# Patient Record
Sex: Female | Born: 1995 | Race: Black or African American | Hispanic: No | Marital: Single | Smoking: Never smoker
Health system: Southern US, Community
[De-identification: ages and names within clinical notes are randomized; demographics above are authoritative.]

## PROBLEM LIST (undated history)

## (undated) DIAGNOSIS — J45909 Unspecified asthma, uncomplicated: Secondary | ICD-10-CM

## (undated) DIAGNOSIS — F419 Anxiety disorder, unspecified: Secondary | ICD-10-CM

## (undated) HISTORY — PX: NO PAST SURGERIES: SHX2092

---
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
## 2014-08-24 ENCOUNTER — Emergency Department: Payer: Self-pay | Admitting: Emergency Medicine

## 2015-05-28 ENCOUNTER — Emergency Department
Admission: EM | Admit: 2015-05-28 | Discharge: 2015-05-28 | Disposition: A | Discharge status: Home / Self Care | Payer: BC Managed Care – PPO | Attending: Emergency Medicine | Admitting: Emergency Medicine

## 2015-05-28 ENCOUNTER — Encounter: Payer: Self-pay | Admitting: *Deleted

## 2015-05-28 DIAGNOSIS — J9801 Acute bronchospasm: Secondary | ICD-10-CM | POA: Diagnosis not present

## 2015-05-28 DIAGNOSIS — R062 Wheezing: Secondary | ICD-10-CM

## 2015-05-28 MED ORDER — BENZONATATE 100 MG PO CAPS: 100.0000 mg | ORAL_CAPSULE | Freq: Three times a day (TID) | ORAL | Status: DC | PRN

## 2015-05-28 MED ORDER — ACETAMINOPHEN 500 MG PO TABS
1000.0000 mg | ORAL_TABLET | Freq: Once | ORAL | Status: AC
  Administered 2015-05-28: 500 mg via ORAL
  Filled 2015-05-28: qty 2

## 2015-05-28 MED ORDER — IPRATROPIUM-ALBUTEROL 0.5-2.5 (3) MG/3ML IN SOLN
3.0000 mL | Freq: Once | RESPIRATORY_TRACT | Status: AC
  Administered 2015-05-28: 3 mL via RESPIRATORY_TRACT
  Filled 2015-05-28: qty 3

## 2015-05-28 NOTE — ED Notes (Signed)
Pt seen by ems and given a breathing treatment this morning.  Pt went to her doctor and was started on prednisone.  Tonight pt awakened upset and started wheezing again.  Pt brouight to er by mother for an eval

## 2015-05-28 NOTE — ED Notes (Signed)
Pt brought in for wheezing today.  Pt seen by ems this morning and again had breathing diff this evening so mother brought pt in for re eval.

## 2015-05-28 NOTE — Discharge Instructions (Signed)
Asthma, Acute Bronchospasm °Acute bronchospasm caused by asthma is also referred to as an asthma attack. Bronchospasm means your air passages become narrowed. The narrowing is caused by inflammation and tightening of the muscles in the air tubes (bronchi) in your lungs. This can make it hard to breathe or cause you to wheeze and cough. °CAUSES °Possible triggers are: °· Animal dander from the skin, hair, or feathers of animals. °· Dust mites contained in house dust. °· Cockroaches. °· Pollen from trees or grass. °· Mold. °· Cigarette or tobacco smoke. °· Air pollutants such as dust, household cleaners, hair sprays, aerosol sprays, paint fumes, strong chemicals, or strong odors. °· Cold air or weather changes. Cold air may trigger inflammation. Winds increase molds and pollens in the air. °· Strong emotions such as crying or laughing hard. °· Stress. °· Certain medicines such as aspirin or beta-blockers. °· Sulfites in foods and drinks, such as dried fruits and wine. °· Infections or inflammatory conditions, such as a flu, cold, or inflammation of the nasal membranes (rhinitis). °· Gastroesophageal reflux disease (GERD). GERD is a condition where stomach acid backs up into your esophagus. °· Exercise or strenuous activity. °SIGNS AND SYMPTOMS  °· Wheezing. °· Excessive coughing, particularly at night. °· Chest tightness. °· Shortness of breath. °DIAGNOSIS  °Your health care provider will ask you about your medical history and perform a physical exam. A chest X-ray or blood testing may be performed to look for other causes of your symptoms or other conditions that may have triggered your asthma attack.  °TREATMENT  °Treatment is aimed at reducing inflammation and opening up the airways in your lungs.  Most asthma attacks are treated with inhaled medicines. These include quick relief or rescue medicines (such as bronchodilators) and controller medicines (such as inhaled corticosteroids). These medicines are sometimes  given through an inhaler or a nebulizer. Systemic steroid medicine taken by mouth or given through an IV tube also can be used to reduce the inflammation when an attack is moderate or severe. Antibiotic medicines are only used if a bacterial infection is present.  °HOME CARE INSTRUCTIONS  °· Rest. °· Drink plenty of liquids. This helps the mucus to remain thin and be easily coughed up. Only use caffeine in moderation and do not use alcohol until you have recovered from your illness. °· Do not smoke. Avoid being exposed to secondhand smoke. °· You play a critical role in keeping yourself in good health. Avoid exposure to things that cause you to wheeze or to have breathing problems. °· Keep your medicines up-to-date and available. Carefully follow your health care provider's treatment plan. °· Take your medicine exactly as prescribed. °· When pollen or pollution is bad, keep windows closed and use an air conditioner or go to places with air conditioning. °· Asthma requires careful medical care. See your health care provider for a follow-up as advised. If you are more than [redacted] weeks pregnant and you were prescribed any new medicines, let your obstetrician know about the visit and how you are doing. Follow up with your health care provider as directed. °· After you have recovered from your asthma attack, make an appointment with your outpatient doctor to talk about ways to reduce the likelihood of future attacks. If you do not have a doctor who manages your asthma, make an appointment with a primary care doctor to discuss your asthma. °SEEK IMMEDIATE MEDICAL CARE IF:  °· You are getting worse. °· You have trouble breathing. If severe, call your local   emergency services (911 in the [place]  You develop chest pain or discomfort.  You are vomiting.  You are not able to keep fluids down.  You are coughing up yellow, green, brown, or bloody sputum.  You have a fever and your symptoms suddenly get worse.  You have  trouble swallowing. MAKE SURE YOU:   Understand these instructions.  Will watch your condition.  Will get help right away if you are not doing well or get worse. Document Released: 12/03/2006 Document Revised: 08/23/2013 Document Reviewed: 02/23/2013 [facility] Patient Information 2015 [place], [place]. This information is not intended to replace advice given to you by your health care provider. Make sure you discuss any questions you have with your health care provider.  Continue the previously prescribed steroids and albuterol inhaler. Consider starting an OTC allergy medicine (Allegra, Claritin, Zyrtec).  Follow-up with your primary provider as scheduled. Return to the ED as needed.
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
## 2015-07-28 ENCOUNTER — Emergency Department: Payer: BC Managed Care – PPO

## 2015-07-28 ENCOUNTER — Emergency Department
Admission: EM | Admit: 2015-07-28 | Discharge: 2015-07-28 | Disposition: A | Discharge status: Home / Self Care | Payer: BC Managed Care – PPO | Attending: Student | Admitting: Student

## 2015-07-28 DIAGNOSIS — Y9389 Activity, other specified: Secondary | ICD-10-CM | POA: Diagnosis not present

## 2015-07-28 DIAGNOSIS — R42 Dizziness and giddiness: Secondary | ICD-10-CM | POA: Insufficient documentation

## 2015-07-28 DIAGNOSIS — Y92410 Unspecified street and highway as the place of occurrence of the external cause: Secondary | ICD-10-CM | POA: Diagnosis not present

## 2015-07-28 DIAGNOSIS — Z3202 Encounter for pregnancy test, result negative: Secondary | ICD-10-CM | POA: Diagnosis not present

## 2015-07-28 DIAGNOSIS — S0990XA Unspecified injury of head, initial encounter: Secondary | ICD-10-CM | POA: Diagnosis not present

## 2015-07-28 DIAGNOSIS — Y998 Other external cause status: Secondary | ICD-10-CM | POA: Insufficient documentation

## 2015-07-28 DIAGNOSIS — R55 Syncope and collapse: Secondary | ICD-10-CM | POA: Diagnosis not present

## 2015-07-28 HISTORY — DX: Anxiety disorder, unspecified: F41.9

## 2015-07-28 HISTORY — DX: Unspecified asthma, uncomplicated: J45.909

## 2015-07-28 LAB — POCT PREGNANCY, URINE: PREG TEST UR: NEGATIVE

## 2015-07-28 LAB — BASIC METABOLIC PANEL
Anion gap: 7 (ref 5–15)
BUN: 7 mg/dL (ref 6–20)
CHLORIDE: 107 mmol/L (ref 101–111)
CO2: 24 mmol/L (ref 22–32)
CREATININE: 0.74 mg/dL (ref 0.44–1.00)
Calcium: 8.8 mg/dL — ABNORMAL LOW (ref 8.9–10.3)
GFR calc Af Amer: >60 mL/min (ref >60)
GFR calc non Af Amer: >60 mL/min (ref >60)
GLUCOSE: 107 mg/dL — AB (ref 65–99)
POTASSIUM: 3.8 mmol/L (ref 3.5–5.1)
Sodium: 138 mmol/L (ref 135–145)

## 2015-07-28 LAB — CBC
HEMATOCRIT: 36.5 % (ref 35.0–47.0)
Hemoglobin: 12.1 g/dL (ref 12.0–16.0)
MCH: 26.7 pg (ref 26.0–34.0)
MCHC: 33.1 g/dL (ref 32.0–36.0)
MCV: 80.7 fL (ref 80.0–100.0)
PLATELETS: 243 10*3/uL (ref 150–440)
RBC: 4.52 MIL/uL (ref 3.80–5.20)
RDW: 15.7 % — AB (ref 11.5–14.5)
WBC: 8 10*3/uL (ref 3.6–11.0)

## 2015-07-28 LAB — URINALYSIS COMPLETE WITH MICROSCOPIC (ARMC ONLY)
Bilirubin Urine: NEGATIVE
Glucose, UA: NEGATIVE mg/dL
Ketones, ur: NEGATIVE mg/dL
Nitrite: NEGATIVE
Protein, ur: 30 mg/dL — AB
Specific Gravity, Urine: 1.02 (ref 1.005–1.030)
pH: 5 (ref 5.0–8.0)

## 2015-07-28 NOTE — ED Notes (Signed)
Patient returned from radiology
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
## 2015-07-28 NOTE — ED Notes (Signed)
Patient transported to X-ray 
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
## 2016-04-14 ENCOUNTER — Emergency Department
Admission: EM | Admit: 2016-04-14 | Discharge: 2016-04-14 | Disposition: A | Discharge status: Home / Self Care | Payer: BC Managed Care – PPO | Attending: Student in an Organized Health Care Education/Training Program | Admitting: Student in an Organized Health Care Education/Training Program

## 2016-04-14 ENCOUNTER — Encounter: Payer: Self-pay | Admitting: *Deleted

## 2016-04-14 DIAGNOSIS — Z79899 Other long term (current) drug therapy: Secondary | ICD-10-CM | POA: Diagnosis not present

## 2016-04-14 DIAGNOSIS — R454 Irritability and anger: Secondary | ICD-10-CM | POA: Diagnosis not present

## 2016-04-14 DIAGNOSIS — R45851 Suicidal ideations: Secondary | ICD-10-CM | POA: Diagnosis present

## 2016-04-14 DIAGNOSIS — J45909 Unspecified asthma, uncomplicated: Secondary | ICD-10-CM | POA: Insufficient documentation

## 2016-04-14 LAB — CBC
HEMATOCRIT: 37.5 % (ref 35.0–47.0)
Hemoglobin: 12.4 g/dL (ref 12.0–16.0)
MCH: 26.9 pg (ref 26.0–34.0)
MCHC: 33.1 g/dL (ref 32.0–36.0)
MCV: 81.2 fL (ref 80.0–100.0)
Platelets: 252 10*3/uL (ref 150–440)
RBC: 4.62 MIL/uL (ref 3.80–5.20)
RDW: 15.1 % — AB (ref 11.5–14.5)
WBC: 8.2 10*3/uL (ref 3.6–11.0)

## 2016-04-14 LAB — COMPREHENSIVE METABOLIC PANEL
ALBUMIN: 3.8 g/dL (ref 3.5–5.0)
ALT: 14 U/L (ref 14–54)
ANION GAP: 9 (ref 5–15)
AST: 19 U/L (ref 15–41)
Alkaline Phosphatase: 64 U/L (ref 38–126)
BILIRUBIN TOTAL: 0.3 mg/dL (ref 0.3–1.2)
BUN: 9 mg/dL (ref 6–20)
CHLORIDE: 108 mmol/L (ref 101–111)
CO2: 20 mmol/L — AB (ref 22–32)
Calcium: 8.9 mg/dL (ref 8.9–10.3)
Creatinine, Ser: 0.81 mg/dL (ref 0.44–1.00)
GFR calc Af Amer: >60 mL/min (ref >60)
GFR calc non Af Amer: >60 mL/min (ref >60)
GLUCOSE: 87 mg/dL (ref 65–99)
POTASSIUM: 4 mmol/L (ref 3.5–5.1)
SODIUM: 137 mmol/L (ref 135–145)
TOTAL PROTEIN: 7.4 g/dL (ref 6.5–8.1)

## 2016-04-14 LAB — URINE DRUG SCREEN, QUALITATIVE (ARMC ONLY)
AMPHETAMINES, UR SCREEN: NOT DETECTED
BENZODIAZEPINE, UR SCRN: NOT DETECTED
Barbiturates, Ur Screen: NOT DETECTED
Cannabinoid 50 Ng, Ur ~~LOC~~: NOT DETECTED
Cocaine Metabolite,Ur ~~LOC~~: NOT DETECTED
MDMA (ECSTASY) UR SCREEN: NOT DETECTED
METHADONE SCREEN, URINE: NOT DETECTED
OPIATE, UR SCREEN: NOT DETECTED
Phencyclidine (PCP) Ur S: NOT DETECTED
Tricyclic, Ur Screen: NOT DETECTED

## 2016-04-14 LAB — POCT PREGNANCY, URINE: PREG TEST UR: NEGATIVE

## 2016-04-14 LAB — SALICYLATE LEVEL: Salicylate Lvl: <4 mg/dL (ref 2.8–30.0)

## 2016-04-14 LAB — ACETAMINOPHEN LEVEL

## 2016-04-14 LAB — ETHANOL: Alcohol, Ethyl (B): <5 mg/dL (ref <5)

## 2016-04-14 NOTE — ED Provider Notes (Signed)
[facility] Emergency Department Provider Note    First MD Initiated Contact with Patient 04/14/16 2202     (approximate)  I have reviewed the triage vital signs and the nursing notes.   HISTORY  Chief Complaint Suicidal    HPI Lisa Hogan is a 20 y.o. female who presents for a brief episode of suicidal thoughts while she was in an argument with her boyfriend. Patient states that she has some underlying anxiety but has not sought care for this previously. States that she was tasting with her boyfriend during this argument became very angry and emotional and stated in text to that she wanted to die. States that she had not previously had any thoughts of suicidal ideation. No history of suicide attempts previously. Denies any intent to harm anyone else.   Past Medical History:  Diagnosis Date  . Anxiety   . Asthma     There are no active problems to display for this patient.   History reviewed. No pertinent surgical history.  Prior to Admission medications   Medication Sig Start Date End Date Taking? Authorizing Provider  albuterol (PROVENTIL HFA;VENTOLIN HFA) 108 (90 BASE) MCG/ACT inhaler Inhale 2 puffs into the lungs every 6 (six) hours as needed for wheezing or shortness of breath.   Yes Historical Provider, MD  CRYSELLE-28 0.3-30 MG-MCG tablet Take 1 tablet by mouth daily.   Yes Historical Provider, MD  benzonatate (TESSALON PERLES) 100 MG capsule Take 1 capsule (100 mg total) by mouth 3 (three) times daily as needed for cough (Take 1-2 per dose). Patient not taking: Reported on 04/14/2016 05/28/15   Charlesetta IvoryJenise V Bacon Menshew, PA-C    Allergies Shellfish allergy  History reviewed. No pertinent family history.  Social History Social History  Substance Use Topics  . Smoking status: Never Smoker  . Smokeless tobacco: Not on file  . Alcohol use No    Review of Systems Patient denies headaches, rhinorrhea, blurry vision, numbness, shortness of  breath, chest pain, edema, cough, abdominal pain, nausea, vomiting, diarrhea, dysuria, fevers, rashes or hallucinations unless otherwise stated above in HPI. ____________________________________________   PHYSICAL EXAM:  VITAL SIGNS: Vitals:   04/14/16 2052  BP: 139/87  Pulse: 83  Temp: 98.9 F (37.2 C)    Constitutional: Alert and oriented. Well appearing and in no acute distress. Eyes: Conjunctivae are normal. PERRL. EOMI. Head: Atraumatic. Nose: No congestion/rhinnorhea. Mouth/Throat: Mucous membranes are moist.  Oropharynx non-erythematous. Neck: No stridor. Painless ROM. No cervical spine tenderness to palpation Hematological/Lymphatic/Immunilogical: No cervical lymphadenopathy. Cardiovascular: Normal rate, regular rhythm. Grossly normal heart sounds.  Good peripheral circulation. Respiratory: Normal respiratory effort.  No retractions. Lungs CTAB. Gastrointestinal: Soft and nontender. No distention. No abdominal bruits. No CVA tenderness. Genitourinary:  Musculoskeletal: No lower extremity tenderness nor edema.  No joint effusions. Neurologic:  Normal speech and language. No gross focal neurologic deficits are appreciated. No gait instability. Skin:  Skin is warm, dry and intact. No rash noted. Psychiatric: Mood and affect are normal. Speech and behavior are normal.  ____________________________________________   LABS (all labs ordered are listed, but only abnormal results are displayed)  Results for orders placed or performed during the hospital encounter of 04/14/16 (from the past 24 hour(s))  Comprehensive metabolic panel     Status: Abnormal   Collection Time: 04/14/16  9:05 PM  Result Value Ref Range   Sodium 137 135 - 145 mmol/L   Potassium 4.0 3.5 - 5.1 mmol/L   Chloride 108 101 - 111  mmol/L   CO2 20 (L) 22 - 32 mmol/L   Glucose, Bld 87 65 - 99 mg/dL   BUN 9 6 - 20 mg/dL   Creatinine, Ser 1.610.81 0.44 - 1.00 mg/dL   Calcium 8.9 8.9 - 09.610.3 mg/dL   Total  Protein 7.4 6.5 - 8.1 g/dL   Albumin 3.8 3.5 - 5.0 g/dL   AST 19 15 - 41 U/L   ALT 14 14 - 54 U/L   Alkaline Phosphatase 64 38 - 126 U/L   Total Bilirubin 0.3 0.3 - 1.2 mg/dL   GFR calc non Af Amer >60 >60 mL/min   GFR calc Af Amer >60 >60 mL/min   Anion gap 9 5 - 15  Ethanol     Status: None   Collection Time: 04/14/16  9:05 PM  Result Value Ref Range   Alcohol, Ethyl (B) <5 <5 mg/dL  Salicylate level     Status: None   Collection Time: 04/14/16  9:05 PM  Result Value Ref Range   Salicylate Lvl <4.0 2.8 - 30.0 mg/dL  Acetaminophen level     Status: Abnormal   Collection Time: 04/14/16  9:05 PM  Result Value Ref Range   Acetaminophen (Tylenol), Serum <10 (L) 10 - 30 ug/mL  cbc     Status: Abnormal   Collection Time: 04/14/16  9:05 PM  Result Value Ref Range   WBC 8.2 3.6 - 11.0 K/uL   RBC 4.62 3.80 - 5.20 MIL/uL   Hemoglobin 12.4 12.0 - 16.0 g/dL   HCT 04.537.5 40.935.0 - 81.147.0 %   MCV 81.2 80.0 - 100.0 fL   MCH 26.9 26.0 - 34.0 pg   MCHC 33.1 32.0 - 36.0 g/dL   RDW 91.415.1 (H) 78.211.5 - 95.614.5 %   Platelets 252 150 - 440 K/uL  Urine Drug Screen, Qualitative     Status: None   Collection Time: 04/14/16  9:05 PM  Result Value Ref Range   Tricyclic, Ur Screen NONE DETECTED NONE DETECTED   Amphetamines, Ur Screen NONE DETECTED NONE DETECTED   MDMA (Ecstasy)Ur Screen NONE DETECTED NONE DETECTED   Cocaine Metabolite,Ur [place] NONE DETECTED NONE DETECTED   Opiate, Ur Screen NONE DETECTED NONE DETECTED   Phencyclidine (PCP) Ur S NONE DETECTED NONE DETECTED   Cannabinoid 50 Ng, Ur [place] NONE DETECTED NONE DETECTED   Barbiturates, Ur Screen NONE DETECTED NONE DETECTED   Benzodiazepine, Ur Scrn NONE DETECTED NONE DETECTED   Methadone Scn, Ur NONE DETECTED NONE DETECTED  Pregnancy, urine POC     Status: None   Collection Time: 04/14/16  9:25 PM  Result Value Ref Range   Preg Test, Ur NEGATIVE NEGATIVE    ____________________________________________   ____________________________________________   PROCEDURES  Procedure(s) performed: none    Critical Care performed: no ____________________________________________   INITIAL IMPRESSION / ASSESSMENT AND PLAN / ED COURSE  Pertinent labs & imaging results that were available during my care of the patient were reviewed by me and considered in my medical decision making (see chart for details).  DDX: Anxiety disorder, suicidal ideation, depression, agitation  Lisa Hogan is a 20 y.o. who presents to the ED with brief suicidal thoughts while in an argument with her boyfriend today. Patient denies any suicidal ideation at this time. She does not have a plan. She has no previous history of suicidal attempts. She appears of right mind with clear sensorium and appropriate thought process. She does not demonstrate any instability. Do not feel the patient is currently at  risk for herself. Do feel this is secondary to brief outburst during an argument. I discussed need for follow-up with PCP and provided referral for psychiatry. Discussed signs and symptoms for which she should return emergently to the hospital.  Have discussed with the patient and available family all diagnostics and treatments performed thus far and all questions were answered to the best of my ability. The patient demonstrates understanding and agreement with plan.   Clinical Course     ____________________________________________   FINAL CLINICAL IMPRESSION(S) / ED DIAGNOSES  Final diagnoses:  Anger reaction      NEW MEDICATIONS STARTED DURING THIS VISIT:  New Prescriptions   No medications on file     Note:  This document was prepared using Dragon voice recognition software and may include unintentional dictation errors.    Willy Eddy, MD 04/14/16 [phone]

## 2016-04-14 NOTE — ED Notes (Signed)
Discharge instructions reviewed with patient. Questions fielded by this RN. Patient verbalizes understanding of instructions. Patient discharged home in stable condition per Robinson MD . No acute distress noted at time of discharge.   

## 2016-04-14 NOTE — ED Triage Notes (Signed)
Pt states she felt badly about lying to her boyfriend for a couple of days. Pt states she texted that she wanted to kill herself to her boyfriend. Pt denies plan and states she had thought about it but never acted upon thoughts to harm herself. Pt denies use of ETOH and drugs today.

## 2016-04-14 NOTE — Discharge Instructions (Signed)
Please return immediately to the emergency department should she have any persistent thoughts of harming herself or others. Return to the ER if you have any additional concerns or questions. Please follow-up with her primary care physician for further management of her symptoms.

## 2016-04-14 NOTE — ED Notes (Signed)
Patient refused to speak to MD and this writer while out in the hallway. Room is being cleaned so patient can be interviewed there.

## 2016-07-25 ENCOUNTER — Ambulatory Visit
Admission: EM | Admit: 2016-07-25 | Discharge: 2016-07-25 | Disposition: A | Discharge status: Home / Self Care | Payer: Worker's Compensation | Attending: Family Medicine | Admitting: Family Medicine

## 2016-07-25 ENCOUNTER — Ambulatory Visit (INDEPENDENT_AMBULATORY_CARE_PROVIDER_SITE_OTHER): Payer: Worker's Compensation

## 2016-07-25 DIAGNOSIS — S86911A Strain of unspecified muscle(s) and tendon(s) at lower leg level, right leg, initial encounter: Secondary | ICD-10-CM

## 2016-07-25 DIAGNOSIS — M25561 Pain in right knee: Secondary | ICD-10-CM

## 2016-07-25 MED ORDER — IBUPROFEN 800 MG PO TABS: 800.0000 mg | ORAL_TABLET | Freq: Three times a day (TID) | ORAL | 0 refills | Status: AC

## 2016-07-25 NOTE — Discharge Instructions (Signed)
Take ibuprofen as needed for pain Follow up with occupational health next week for re-evaluation.

## 2016-07-25 NOTE — ED Provider Notes (Signed)
CSN: [id number]     Arrival date & time 07/25/16  1716 History   First MD Initiated Contact with Patient 07/25/16 1907     Chief Complaint  Patient presents with  . Knee Pain   (Consider location/radiation/quality/duration/timing/severity/associated sxs/prior Treatment) Patient is a well-appearing 20 y.o female, presents today for right knee pain. She had a fall last night around 10 pm. Patient states that she was running at work last night due to a fire alarm and fell on right knee stating that it "Slammed down pretty hard". Patient reports that the knee pain started this morning around 11 am. Patient reports that she is able to ambulate but is painful to walk. She reports the pain 3/10 currently. She denies swelling at the area.       Past Medical History:  Diagnosis Date  . Anxiety   . Asthma    Past Surgical History:  Procedure Laterality Date  . NO PAST SURGERIES     History reviewed. No pertinent family history. Social History  Substance Use Topics  . Smoking status: Never Smoker  . Smokeless tobacco: Never Used  . Alcohol use Yes     Comment: occasionally   OB History    No data available     Review of Systems  All other systems reviewed and are negative.   Allergies  Shellfish allergy  Home Medications   Prior to Admission medications   Medication Sig Start Date End Date Taking? Authorizing Provider  albuterol (PROVENTIL HFA;VENTOLIN HFA) 108 (90 BASE) MCG/ACT inhaler Inhale 2 puffs into the lungs every 6 (six) hours as needed for wheezing or shortness of breath.   Yes Historical Provider, MD  CRYSELLE-28 0.3-30 MG-MCG tablet Take 1 tablet by mouth daily.   Yes Historical Provider, MD  ibuprofen (ADVIL,MOTRIN) 800 MG tablet Take 1 tablet (800 mg total) by mouth 3 (three) times daily. 07/25/16 07/30/16  Lucia EstelleFeng Athanasios Heldman, NP   Meds Ordered and Administered this Visit  Medications - No data to display  BP 120/60 (BP Location: Left Arm)   Pulse 82   Temp 98.7 F  (37.1 C) (Tympanic)   Resp 17   Ht 5\' 9"  (1.753 m)   Wt 198 lb (89.8 kg)   LMP 07/11/2016   SpO2 100%   BMI 29.24 kg/m  No data found.   Physical Exam  Constitutional: She appears well-developed and well-nourished.  Cardiovascular: Normal rate, regular rhythm and normal heart sounds.   Pulmonary/Chest: Effort normal and breath sounds normal.  Musculoskeletal:  Right knee has full ROM, has some tenderness over the patella on palpation. She is ambulatory.   Nursing note and vitals reviewed.   Urgent Care Course   Clinical Course     Procedures (including critical care time)  Labs Review Labs Reviewed - No data to display  Imaging Review Dg Knee Complete 4 Views Right  Result Date: 07/25/2016 CLINICAL DATA:  Right knee pain after fall EXAM: RIGHT KNEE - COMPLETE 4+ VIEW COMPARISON:  None. FINDINGS: No evidence of fracture, dislocation, or joint effusion. No evidence of arthropathy or other focal bone abnormality. Soft tissues are unremarkable. IMPRESSION: Negative. Electronically Signed   By: Jasmine PangKim  Fujinaga M.D.   On: 07/25/2016 18:58     Visual Acuity Review  Right Eye Distance:   Left Eye Distance:   Bilateral Distance:    Right Eye Near:   Left Eye Near:    Bilateral Near:         MDM   1.  Acute pain of right knee   2. Strain of right knee, initial encounter    1) Most likely just a soft tissue injury. Continue to monitor the pain at home. Take ibuprofen PRN for pain. If pain does not improve in 1-2 weeks, then please follow up to have it further evaluated.   2) Work note given to return back to work tomorrow on [employer] duty with restrictions for 5 days and f/u with occupational health in 5 days prior to returning back on full duty.     Lucia EstelleFeng Mekiah [place], NP 07/25/16 1942

## 2016-07-25 NOTE — ED Triage Notes (Signed)
Patient complains of falling last night at work around 945pm-10pm. Patient states that she was running at work last night due to a fire alarm. Patient fell on her right knee, states that it "slammed down pretty hard". Patient reports that she didn't mention anything at work because she did not have pain until this morning around 1130am. Patient states that the pain is worse with bending.

## 2017-04-17 ENCOUNTER — Emergency Department
Admission: EM | Admit: 2017-04-17 | Discharge: 2017-04-17 | Disposition: A | Discharge status: Home / Self Care | Payer: BC Managed Care – PPO | Attending: Emergency Medicine | Admitting: Emergency Medicine

## 2017-04-17 ENCOUNTER — Encounter: Payer: Self-pay | Admitting: Emergency Medicine

## 2017-04-17 DIAGNOSIS — J45909 Unspecified asthma, uncomplicated: Secondary | ICD-10-CM | POA: Insufficient documentation

## 2017-04-17 DIAGNOSIS — Z79899 Other long term (current) drug therapy: Secondary | ICD-10-CM | POA: Insufficient documentation

## 2017-04-17 DIAGNOSIS — F419 Anxiety disorder, unspecified: Secondary | ICD-10-CM | POA: Insufficient documentation

## 2017-04-17 DIAGNOSIS — R0602 Shortness of breath: Secondary | ICD-10-CM | POA: Diagnosis present

## 2017-04-17 DIAGNOSIS — R0600 Dyspnea, unspecified: Secondary | ICD-10-CM

## 2017-04-17 NOTE — ED Notes (Signed)
When patient asked if she thought it was anxiety or her asthma she states, "This is my anxiety". Denies HI or SI. Denies taking any medication for anxiety.

## 2017-04-17 NOTE — ED Provider Notes (Signed)
[facility] Emergency Department Provider Note  Time seen: 6:22 PM  I have reviewed the triage vital signs and the nursing notes.   HISTORY  Chief Complaint Panic Attack    HPI Lisa Hogan is a 21 y.o. female with a past medical history of asthma, anxiety, presents the emergency department with shortness of breath. 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 She just finished jogging 1.5 milesand began feeling extremely short of breath. Patient states she cannot catch her breath and was hyperventilating. EMS was on scene and checked her vitals which showed a normal pulse ox per report. Patient denied any wheeze chest pain or chest tightness. States a history of anxiety and states this felt like a panic attack to her. Upon arrival to the emergency department the patient is calm with no complaints. Denies any trouble breathing. Denies any chest pain. Denies any abdominal pain. States while jogging she did have a pain in her right lower abdomen but states this happens every time she runs and denies any current discomfort.  Past Medical History:  Diagnosis Date  . Anxiety   . Asthma     There are no active problems to display for this patient.   Past Surgical History:  Procedure Laterality Date  . NO PAST SURGERIES      Prior to Admission medications   Medication Sig Start Date End Date Taking? Authorizing Provider  albuterol (PROVENTIL HFA;VENTOLIN HFA) 108 (90 BASE) MCG/ACT inhaler Inhale 2 puffs into the lungs every 6 (six) hours as needed for wheezing or shortness of breath.    [provider]  CRYSELLE-28 0.3-30 MG-MCG tablet Take 1 tablet by mouth daily.    [provider]    Allergies  Allergen Reactions  . Shellfish Allergy Anaphylaxis    No family history on file.  Social History Social History  Substance Use Topics  . Smoking status: Never Smoker  . Smokeless tobacco:  Never Used  . Alcohol use Yes     Comment: occasionally    Review of Systems Constitutional: Negative for fever. Cardiovascular: Negative for chest pain. Respiratory: Positive for shortness of breath, now resolved Gastrointestinal: Negative for abdominal pain, vomiting Neurological: Negative for headache All other ROS negative  ____________________________________________   PHYSICAL EXAM:  VITAL SIGNS: ED Triage Vitals  Enc Vitals Group     BP 04/17/17 1754 123/78     Pulse Rate 04/17/17 1754 96     Resp 04/17/17 1754 (!) 22     Temp 04/17/17 1754 98.4 F (36.9 C)     Temp Source 04/17/17 1754 Oral     SpO2 04/17/17 1754 100 %     Weight 04/17/17 1755 213 lb (96.6 kg)     Height 04/17/17 1755 5\' 9"  (1.753 m)     Head Circumference --      Peak Flow --      Pain Score 04/17/17 1754 6     Pain Loc --      Pain Edu? --      Excl. in GC? --     Constitutional: Alert and oriented. Well appearing and in no distress. Eyes: Normal exam ENT   Head: Normocephalic and atraumatic.   Mouth/Throat: Mucous membranes are moist. Cardiovascular: Normal rate, regular rhythm.  Respiratory: Normal respiratory effort without tachypnea nor retractions. Breath sounds are clear  Gastrointestinal: Soft and nontender. No distention. Musculoskeletal: Nontender with normal range of motion in all extremities.  Neurologic:  Normal speech and language. No gross focal neurologic deficits Skin:  Skin is warm, dry and intact.  Psychiatric: Mood and affect are normal.   ____________________________________________    EKG  EKG reviewed and interpreted by myself shows normal sinus rhythm at 84 bpm, narrow QRS, normal axis, normal intervals, no concerning ST changes.   INITIAL IMPRESSION / ASSESSMENT AND PLAN / ED COURSE  Pertinent labs & imaging results that were available during my care of the patient were reviewed by me and considered in my medical decision making (see chart for  details).  Patient presents to the emergency department for shortness of breath. Patient's symptoms have now resolved. Denies any chest pain at any point. Overall the patient appears very well with reassuring vitals. Lung sounds are clear without wheeze. We'll obtain an EKG and closely monitor on cardiac monitor in the emergency department. Patient states this felt like a panic attack which she has had before. Patient is currently not on any anxiety medications.  Patient continues to appear very well in the emergency department. Very reassuring EKG and remains well-appearing vitals on telemetry. We will discharge with PCP follow-up.  ____________________________________________   FINAL CLINICAL IMPRESSION(S) / ED DIAGNOSES  Shortness of breath Anxiety    Minna Antis, MD 04/17/17 1958

## 2017-04-17 NOTE — ED Notes (Signed)
Patient no longer hyperventilating. Even and non labored respirations noted.

## 2017-04-17 NOTE — ED Triage Notes (Signed)
Patient presents to ED via [facility]. 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 Patient hyperventilating during assessment. Attempted coached breathing, patient states "I'm trying I just can't stop". Patient tearful. Patient denies CP. Report HA. EMS placed cold rag to patients face. A&O x4.

## 2017-10-08 ENCOUNTER — Encounter: Payer: Self-pay | Admitting: Emergency Medicine

## 2017-10-08 ENCOUNTER — Emergency Department
Admission: EM | Admit: 2017-10-08 | Discharge: 2017-10-08 | Disposition: A | Discharge status: Home / Self Care | Payer: BC Managed Care – PPO | Attending: Emergency Medicine | Admitting: Emergency Medicine

## 2017-10-08 ENCOUNTER — Other Ambulatory Visit: Payer: Self-pay

## 2017-10-08 DIAGNOSIS — J45909 Unspecified asthma, uncomplicated: Secondary | ICD-10-CM | POA: Insufficient documentation

## 2017-10-08 DIAGNOSIS — Z79899 Other long term (current) drug therapy: Secondary | ICD-10-CM | POA: Insufficient documentation

## 2017-10-08 DIAGNOSIS — J020 Streptococcal pharyngitis: Secondary | ICD-10-CM

## 2017-10-08 LAB — GROUP A STREP BY PCR: Group A Strep by PCR: DETECTED — AB

## 2017-10-08 MED ORDER — LIDOCAINE VISCOUS 2 % MT SOLN: 10.0000 mL | OROMUCOSAL | 0 refills | Status: DC | PRN

## 2017-10-08 MED ORDER — IBUPROFEN 100 MG/5ML PO SUSP
600.0000 mg | Freq: Once | ORAL | Status: AC
  Administered 2017-10-08: 600 mg via ORAL
  Filled 2017-10-08 (×2): qty 30

## 2017-10-08 MED ORDER — AMOXICILLIN-POT CLAVULANATE 875-125 MG PO TABS: 1.0000 | ORAL_TABLET | Freq: Two times a day (BID) | ORAL | 0 refills | Status: AC

## 2017-10-08 NOTE — ED Triage Notes (Signed)
Presents with sore throat since yesterday.

## 2017-10-08 NOTE — ED Provider Notes (Signed)
[facility] Provider Note  ____________________________________________  Time seen: Approximately 12:49 PM  I have reviewed the triage vital signs and the nursing notes.   HISTORY  Chief Complaint Sore Throat    HPI Lisa Hogan is a 22 y.o. female that presents emergency department for evaluation of sore throat for 2 days.  Patient is unsure of fever.  No sick contacts.  She denies nasal congestion, cough, shortness of breath, chest pain, nausea, vomiting, abdominal pain.   Past Medical History:  Diagnosis Date  . Anxiety   . Asthma     There are no active problems to display for this patient.   Past Surgical History:  Procedure Laterality Date  . NO PAST SURGERIES      Prior to Admission medications   Medication Sig Start Date End Date Taking? Authorizing Provider  albuterol (PROVENTIL HFA;VENTOLIN HFA) 108 (90 BASE) MCG/ACT inhaler Inhale 2 puffs into the lungs every 6 (six) hours as needed for wheezing or shortness of breath.    [provider]  amoxicillin-clavulanate (AUGMENTIN) 875-125 MG tablet Take 1 tablet by mouth 2 (two) times daily for 10 days. 10/08/17 10/18/17  Enid Derry, PA-C  CRYSELLE-28 0.3-30 MG-MCG tablet Take 1 tablet by mouth daily.    [provider]  lidocaine (XYLOCAINE) 2 % solution Use as directed 10 mLs in the mouth or throat as needed for mouth pain. 10/08/17   Enid Derry, PA-C    Allergies Shellfish allergy  No family history on file.  Social History Social History   Tobacco Use  . Smoking status: Never Smoker  . Smokeless tobacco: Never Used  Substance Use Topics  . Alcohol use: Yes    Comment: occasionally  . Drug use: No     Review of Systems  Eyes: No visual changes. No discharge. ENT: Negative for congestion and rhinorrhea. Cardiovascular: No chest pain. Respiratory: Negative for cough. No SOB. Gastrointestinal: No abdominal pain.  No nausea, no  vomiting.  No diarrhea.  No constipation. Musculoskeletal: Negative for musculoskeletal pain. Skin: Negative for rash, abrasions, lacerations, ecchymosis. Neurological: Negative for headaches.   ____________________________________________   PHYSICAL EXAM:  VITAL SIGNS: ED Triage Vitals  Enc Vitals Group     BP 10/08/17 1126 (!) 103/57     Pulse Rate 10/08/17 1126 97     Resp 10/08/17 1126 18     Temp 10/08/17 1126 (!) 100.4 F (38 C)     Temp Source 10/08/17 1126 Oral     SpO2 10/08/17 1126 100 %     Weight 10/08/17 1126 205 lb (93 kg)     Height 10/08/17 1126 5\' 9"  (1.753 m)     Head Circumference --      Peak Flow --      Pain Score 10/08/17 1127 10     Pain Loc --      Pain Edu? --      Excl. in GC? --      Constitutional: Alert and oriented. Well appearing and in no acute distress. Eyes: Conjunctivae are normal. PERRL. EOMI. No discharge. Head: Atraumatic. ENT: No frontal and maxillary sinus tenderness.      Ears: Tympanic membranes pearly gray with good landmarks. No discharge.      Nose: No congestion/rhinnorhea.      Mouth/Throat: Mucous membranes are moist. Oropharynx erythematous. Tonsils enlarged 1+ bilaterally. No exudates. Uvula midline. Neck: No stridor.   Hematological/Lymphatic/Immunilogical: Anterior cervical lymphadenopathy. Cardiovascular: Normal rate, regular rhythm.  Good peripheral  circulation. Respiratory: Normal respiratory effort without tachypnea or retractions. Lungs CTAB. Good air entry to the bases with no decreased or absent breath sounds. Gastrointestinal: Bowel sounds 4 quadrants. Soft and nontender to palpation. No guarding or rigidity. No palpable masses. No distention. Musculoskeletal: Full range of motion to all extremities. No gross deformities appreciated. Neurologic:  Normal speech and language. No gross focal neurologic deficits are appreciated.  Skin:  Skin is warm, dry and intact. No rash  noted.    ____________________________________________   LABS (all labs ordered are listed, but only abnormal results are displayed)  Labs Reviewed  GROUP A STREP BY PCR - Abnormal; Notable for the following components:      Result Value   Group A Strep by PCR DETECTED (*)    All other components within normal limits   ____________________________________________  EKG   ____________________________________________  RADIOLOGY   No results found.  ____________________________________________    PROCEDURES  Procedure(s) performed:    Procedures    Medications  ibuprofen (ADVIL,MOTRIN) 100 MG/5ML suspension 600 mg (600 mg Oral Given 10/08/17 1144)     ____________________________________________   INITIAL IMPRESSION / ASSESSMENT AND PLAN / ED COURSE  Pertinent labs & imaging results that were available during my care of the patient were reviewed by me and considered in my medical decision making (see chart for details).  Review of the  [place] was performed in accordance of the [facility] prior to dispensing any controlled drugs.     Patient's diagnosis is consistent with strep throat. Vital signs and exam are reassuring. Strep positive. Patient appears well and is staying well hydrated. Patient should alternate tylenol and ibuprofen for fever. Patient feels comfortable going home. Patient will be discharged home with prescriptions for Augmentin. Patient is to follow up with PCP as needed or otherwise directed. Patient is given ED precautions to return to the ED for any worsening or new symptoms.     ____________________________________________  FINAL CLINICAL IMPRESSION(S) / ED DIAGNOSES  Final diagnoses:  Strep throat      NEW MEDICATIONS STARTED DURING THIS VISIT:  ED Discharge Orders        Ordered    amoxicillin-clavulanate (AUGMENTIN) 875-125 MG tablet  2 times daily     10/08/17 1250    lidocaine (XYLOCAINE) 2 % solution  As needed     10/08/17  1250          This chart was dictated using voice recognition software/Dragon. Despite best efforts to proofread, errors can occur which can change the meaning. Any change was purely unintentional.    Enid DerryWagner, Tonette Koehne, PA-C 10/08/17 1535    Phineas SemenGoodman, Graydon, MD 10/09/17 [phone]

## 2018-06-04 IMAGING — CR DG KNEE COMPLETE 4+V*R*
4 series · 4 of 4 positions shown · non-contrast
Comparison: None.

CLINICAL DATA: Right knee pain after fall

EXAM:
RIGHT KNEE - COMPLETE 4+ VIEW

[knee ap (1 of 3)]
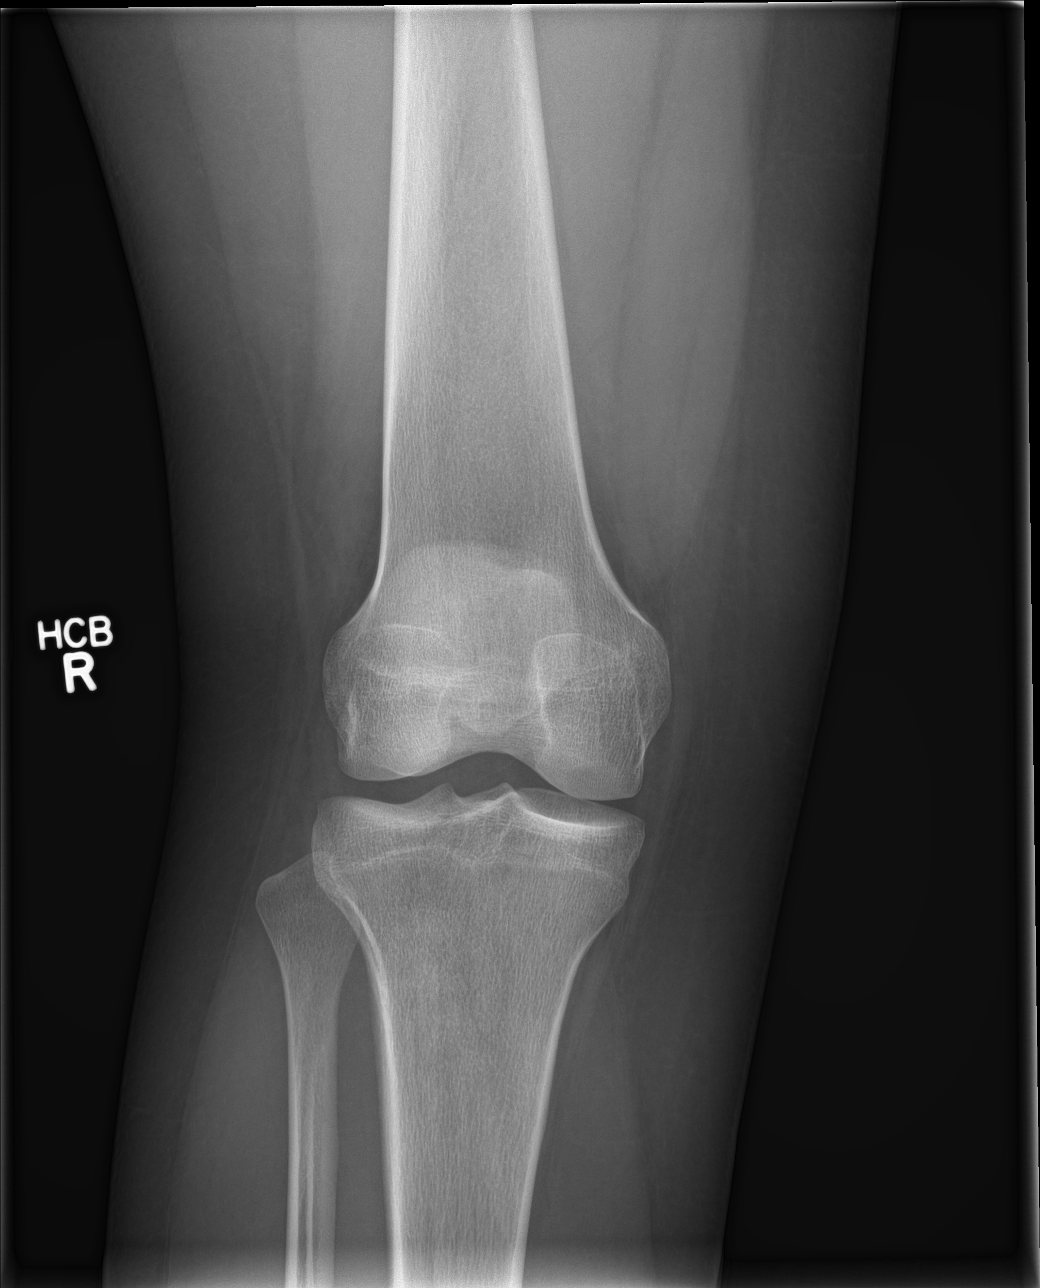

[knee ap (2 of 3)]
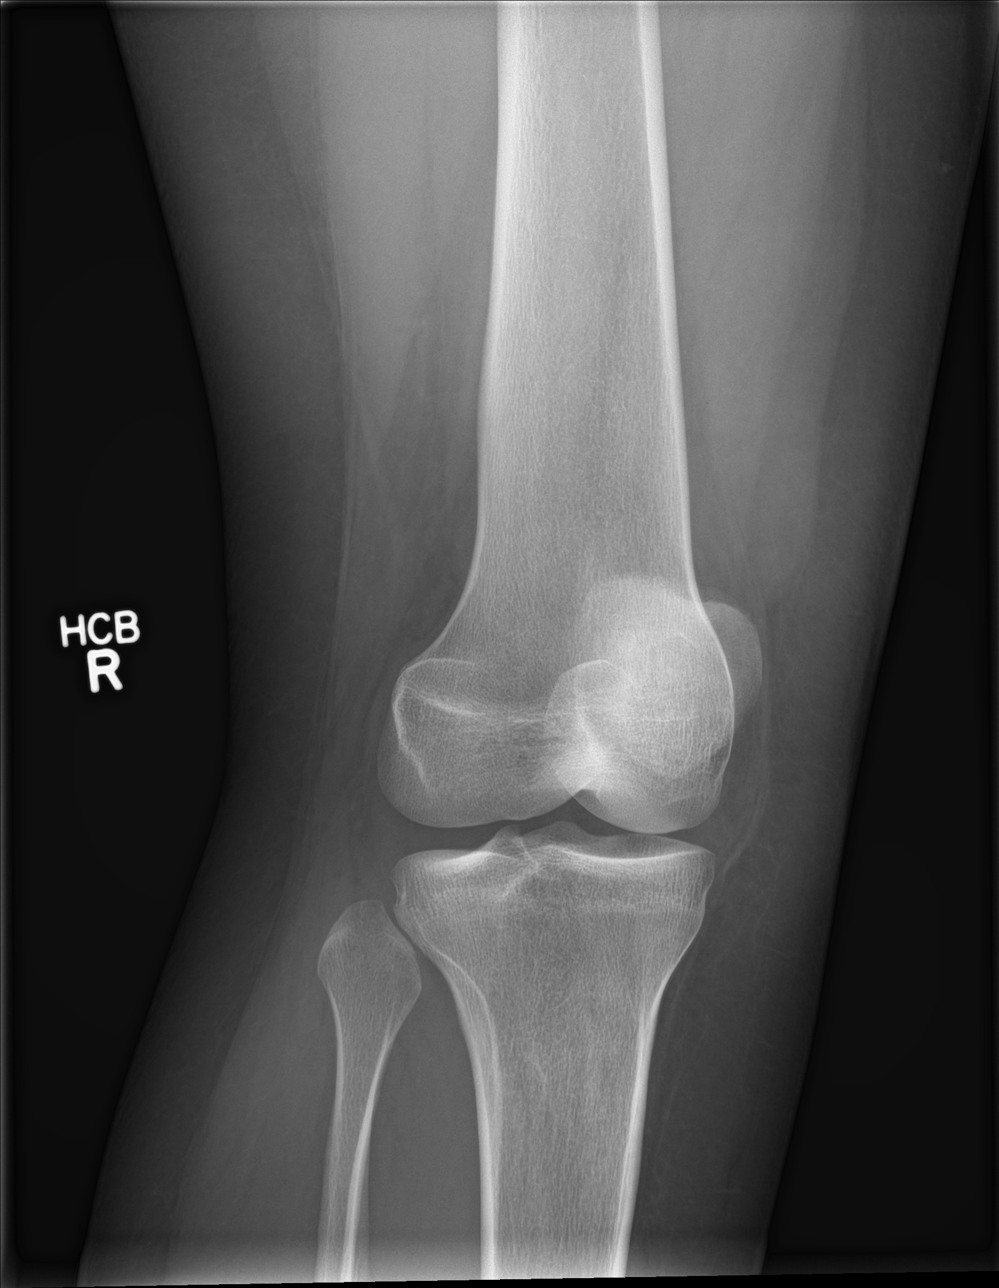

[knee ap (3 of 3)]
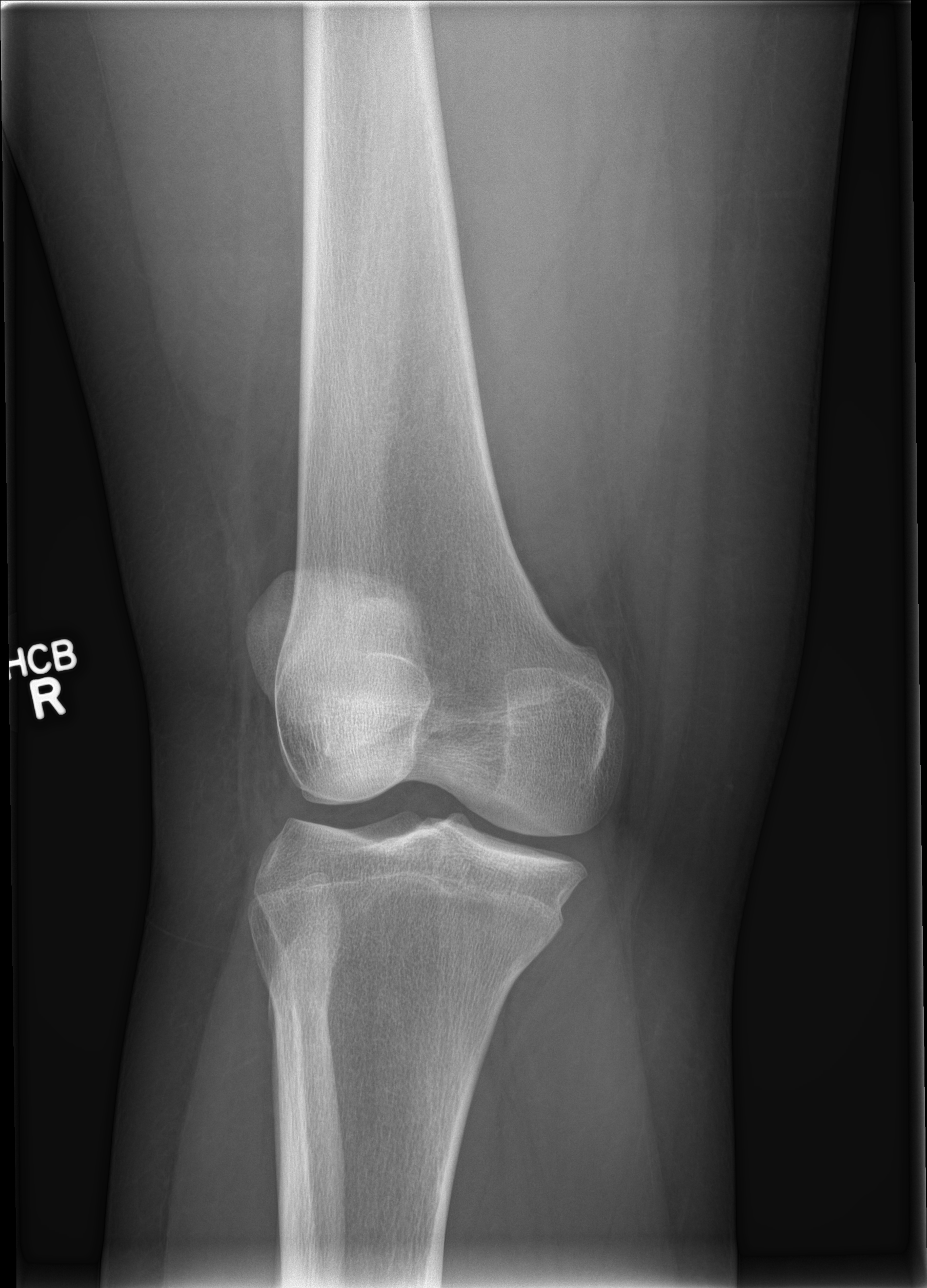

[knee lat]
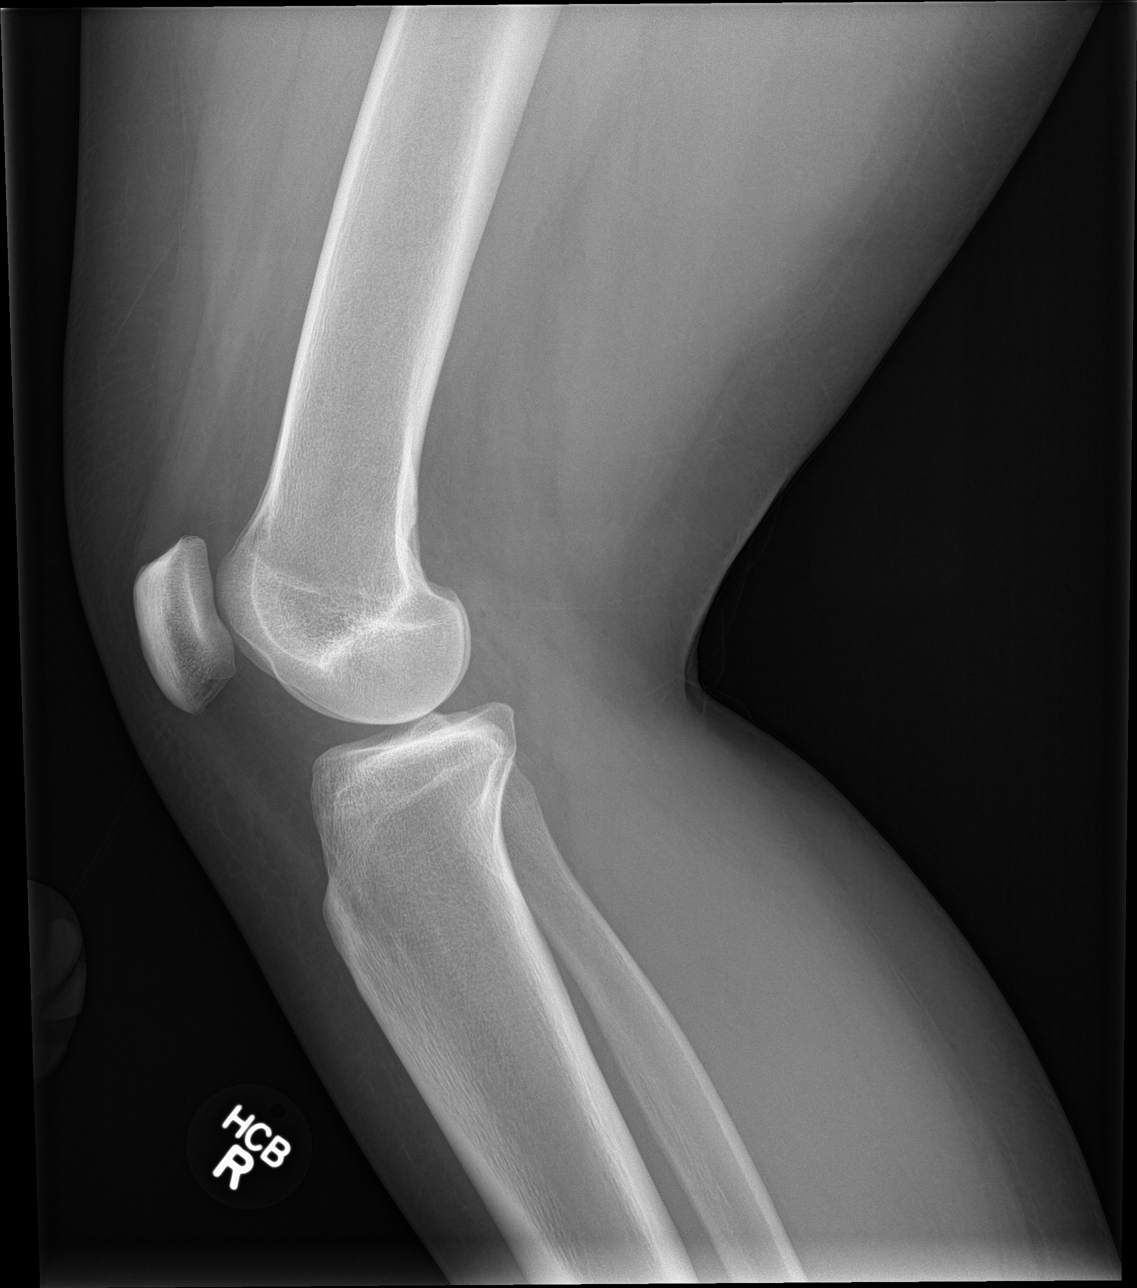

[4 of 4 positions shown; findings below may reference images not displayed]

FINDINGS: No evidence of fracture, dislocation, or joint effusion. No evidence
of arthropathy or other focal bone abnormality. Soft tissues are
unremarkable.
IMPRESSION: Negative.

## 2019-07-13 ENCOUNTER — Other Ambulatory Visit: Payer: Self-pay

## 2019-07-13 ENCOUNTER — Encounter: Payer: Self-pay | Admitting: Certified Nurse Midwife

## 2019-07-13 ENCOUNTER — Ambulatory Visit (INDEPENDENT_AMBULATORY_CARE_PROVIDER_SITE_OTHER): Payer: No Typology Code available for payment source | Admitting: Certified Nurse Midwife

## 2019-07-13 VITALS — BP 118/85 | HR 91 | Ht 69.0 in | Wt 255.1 lb

## 2019-07-13 DIAGNOSIS — N926 Irregular menstruation, unspecified: Secondary | ICD-10-CM | POA: Diagnosis not present

## 2019-07-13 LAB — POCT URINE PREGNANCY: Preg Test, Ur: POSITIVE — AB

## 2019-07-13 NOTE — Progress Notes (Signed)
Patient presented for a confirmation of pregnancy. UPT positive. AT was called to hospital- on call- unable to see patient. OTC sheet reviewed with patient along with the schedule of appointments. All questions were answered. Prenatal vitamin samples given. Ultrasound and NV scheduled.

## 2019-07-14 NOTE — Patient Instructions (Signed)

## 2019-07-15 ENCOUNTER — Telehealth: Payer: Self-pay

## 2019-07-15 NOTE — Telephone Encounter (Signed)
Mychart message sent to patient regarding genetic screening info.

## 2019-07-26 ENCOUNTER — Other Ambulatory Visit (INDEPENDENT_AMBULATORY_CARE_PROVIDER_SITE_OTHER): Payer: No Typology Code available for payment source

## 2019-07-26 ENCOUNTER — Other Ambulatory Visit: Payer: Self-pay | Admitting: Certified Nurse Midwife

## 2019-07-26 ENCOUNTER — Other Ambulatory Visit: Payer: Self-pay

## 2019-07-26 DIAGNOSIS — Z3A01 Less than 8 weeks gestation of pregnancy: Secondary | ICD-10-CM

## 2019-07-26 DIAGNOSIS — O3481 Maternal care for other abnormalities of pelvic organs, first trimester: Secondary | ICD-10-CM

## 2019-07-26 DIAGNOSIS — N8311 Corpus luteum cyst of right ovary: Secondary | ICD-10-CM

## 2019-07-26 DIAGNOSIS — Z789 Other specified health status: Secondary | ICD-10-CM

## 2019-08-08 ENCOUNTER — Telehealth: Payer: Self-pay

## 2019-08-08 ENCOUNTER — Other Ambulatory Visit: Payer: Self-pay

## 2019-08-08 ENCOUNTER — Ambulatory Visit (INDEPENDENT_AMBULATORY_CARE_PROVIDER_SITE_OTHER): Payer: No Typology Code available for payment source | Admitting: Certified Nurse Midwife

## 2019-08-08 VITALS — BP 114/73 | HR 96 | Ht 69.0 in | Wt 260.8 lb

## 2019-08-08 DIAGNOSIS — Z0283 Encounter for blood-alcohol and blood-drug test: Secondary | ICD-10-CM

## 2019-08-08 DIAGNOSIS — Z113 Encounter for screening for infections with a predominantly sexual mode of transmission: Secondary | ICD-10-CM

## 2019-08-08 DIAGNOSIS — R638 Other symptoms and signs concerning food and fluid intake: Secondary | ICD-10-CM

## 2019-08-08 DIAGNOSIS — Z3401 Encounter for supervision of normal first pregnancy, first trimester: Secondary | ICD-10-CM

## 2019-08-08 NOTE — Telephone Encounter (Signed)
Letter done and my chart message sent to patient .

## 2019-08-08 NOTE — Progress Notes (Signed)
      Salli Quarry presents for NOB nurse intake visit. Pregnancy confirmation done at [facility],07/13/19, with Philip Aspen.  G1.  P0.  LMP 06/07/19.  EDD 03/13/2020.  GA [redacted]w[redacted]d . Pregnancy education material explained and given.  0 cats in the home.  NOB labs ordered. BMI greater than 30. TSH/HbgA1c ordered. Sickle cell order due to race. HIV and drug screen explained and ordered. Genetic screening discussed. Genetic testing; Unsure. Pt to discuss genetic testing with provider. PNV encouraged. Pt to follow up with provider in 4 weeks for NOB physical. FMLA form reviewed and signed by patient. [facility] Financial Policy reviewed with patient.

## 2019-08-08 NOTE — Patient Instructions (Signed)
WHAT OB PATIENTS CAN EXPECT   Confirmation of pregnancy and ultrasound ordered if medically indicated-[redacted] weeks gestation  New OB (NOB) intake with nurse and New OB (NOB) labs- [redacted] weeks gestation  New OB (NOB) physical examination with provider- 11/[redacted] weeks gestation  Flu vaccine-[redacted] weeks gestation  Anatomy scan-[redacted] weeks gestation  Glucose tolerance test, blood work to test for anemia, T-dap vaccine-[redacted] weeks gestation  Vaginal swabs/cultures-STD/Group B strep-[redacted] weeks gestation  Appointments every 4 weeks until 28 weeks  Every 2 weeks from 28 weeks until 36 weeks  Weekly visits from 36 weeks until delivery  Prenatal Care Prenatal care is health care during pregnancy. It helps you and your unborn baby (fetus) stay as healthy as possible. Prenatal care may be provided by a midwife, a family practice health care provider, or a childbirth and pregnancy specialist (obstetrician). How does this affect me? During pregnancy, you will be closely monitored for any new conditions that might develop. To lower your risk of pregnancy complications, you and your health care provider will talk about any underlying conditions you have. How does this affect my baby? Early and consistent prenatal care increases the chance that your baby will be healthy during pregnancy. Prenatal care lowers the risk that your baby will be:  Born early (prematurely).  Smaller than expected at birth (small for gestational age). What can I expect at the first prenatal care visit? Your first prenatal care visit will likely be the longest. You should schedule your first prenatal care visit as soon as you know that you are pregnant. Your first visit is a good time to talk about any questions or concerns you have about pregnancy. At your visit, you and your health care provider will talk about:  Your medical history, including: ? Any past pregnancies. ? Your family's medical history. ? The baby's father's medical  history. ? Any long-term (chronic) health conditions you have and how you manage them. ? Any surgeries or procedures you have had. ? Any current over-the-counter or prescription medicines, herbs, or supplements you are taking.  Other factors that could pose a risk to your baby, including:  Your home setting and your stress levels, including: ? Exposure to abuse or violence. ? Household financial strain. ? Mental health conditions you have.  Your daily health habits, including diet and exercise. Your health care provider will also:  Measure your weight, height, and blood pressure.  Do a physical exam, including a pelvic and breast exam.  Perform blood tests and urine tests to check for: ? Urinary tract infection. ? Sexually transmitted infections (STIs). ? Low iron levels in your blood (anemia). ? Blood type and certain proteins on red blood cells (Rh antibodies). ? Infections and immunity to viruses, such as hepatitis B and rubella. ? HIV (human immunodeficiency virus).  Do an ultrasound to confirm your baby's growth and development and to help predict your estimated due date (EDD). This ultrasound is done with a probe that is inserted into the vagina (transvaginal ultrasound).  Discuss your options for genetic screening.  Give you information about how to keep yourself and your baby healthy, including: ? Nutrition and taking vitamins. ? Physical activity. ? How to manage pregnancy symptoms such as nausea and vomiting (morning sickness). ? Infections and substances that may be harmful to your baby and how to avoid them. ? Food safety. ? Dental care. ? Working. ? Travel. ? Warning signs to watch for and when to call your health care provider. How often will  I have prenatal care visits? After your first prenatal care visit, you will have regular visits throughout your pregnancy. The visit schedule is often as follows:  Up to week 28 of pregnancy: once every 4 weeks.  28-36  weeks: once every 2 weeks.  After 36 weeks: every week until delivery. Some women may have visits more or less often depending on any underlying health conditions and the health of the baby. Keep all follow-up and prenatal care visits as told by your health care provider. This is important. What happens during routine prenatal care visits? Your health care provider will:  Measure your weight and blood pressure.  Check for fetal heart sounds.  Measure the height of your uterus in your abdomen (fundal height). This may be measured starting around week 20 of pregnancy.  Check the position of your baby inside your uterus.  Ask questions about your diet, sleeping patterns, and whether you can feel the baby move.  Review warning signs to watch for and signs of labor.  Ask about any pregnancy symptoms you are having and how you are dealing with them. Symptoms may include: ? Headaches. ? Nausea and vomiting. ? Vaginal discharge. ? Swelling. ? Fatigue. ? Constipation. ? Any discomfort, including back or pelvic pain. Make a list of questions to ask your health care provider at your routine visits. What tests might I have during prenatal care visits? You may have blood, urine, and imaging tests throughout your pregnancy, such as:  Urine tests to check for glucose, protein, or signs of infection.  Glucose tests to check for a form of diabetes that can develop during pregnancy (gestational diabetes mellitus). This is usually done around week 24 of pregnancy.  An ultrasound to check your baby's growth and development and to check for birth defects. This is usually done around week 20 of pregnancy.  A test to check for group B strep (GBS) infection. This is usually done around week 36 of pregnancy.  Genetic testing. This may include blood or imaging tests, such as an ultrasound. Some genetic tests are done during the first trimester and some are done during the second trimester. What else  can I expect during prenatal care visits? Your health care provider may recommend getting certain vaccines during pregnancy. These may include:  A yearly flu shot (annual influenza vaccine). This is especially important if you will be pregnant during flu season.  Tdap (tetanus, diphtheria, pertussis) vaccine. Getting this vaccine during pregnancy can protect your baby from whooping cough (pertussis) after birth. This vaccine may be recommended between weeks 27 and 36 of pregnancy. Later in your pregnancy, your health care provider may give you information about:  Childbirth and breastfeeding classes.  Choosing a health care provider for your baby.  Umbilical cord banking.  Breastfeeding.  Birth control after your baby is born.  The hospital labor and delivery unit and how to tour it.  Registering at the hospital before you go into labor. Where to find more information  Office on Women's Health: LegalWarrants.gl  American Pregnancy Association: americanpregnancy.org  March of Dimes: marchofdimes.org Summary  Prenatal care helps you and your baby stay as healthy as possible during pregnancy.  Your first prenatal care visit will most likely be the longest.  You will have visits and tests throughout your pregnancy to monitor your health and your baby's health.  Bring a list of questions to your visits to ask your health care provider.  Make sure to keep all follow-up and prenatal  care visits with your health care provider. This information is not intended to replace advice given to you by your health care provider. Make sure you discuss any questions you have with your health care provider. Document Released: 08/21/2003 Document Revised: 12/08/2018 Document Reviewed: 08/17/2017 Elsevier Patient Education  2020 [employer]. How a Baby Grows During Pregnancy  Pregnancy begins when a female's sperm enters a female's egg (fertilization). Fertilization usually happens in one of the  tubes (fallopian tubes) that connect the ovaries to the womb (uterus). The fertilized egg moves down the fallopian tube to the uterus. Once it reaches the uterus, it implants into the lining of the uterus and begins to grow. For the first 10 weeks, the fertilized egg is called an embryo. After 10 weeks, it is called a fetus. As the fetus continues to grow, it receives oxygen and nutrients through tissue (placenta) that grows to support the developing baby. The placenta is the life support system for the baby. It provides oxygen and nutrition and removes waste. Learning as much as you can about your pregnancy and how your baby is developing can help you enjoy the experience. It can also make you aware of when there might be a problem and when to ask questions. How long does a typical pregnancy last? A pregnancy usually lasts 280 days, or about 40 weeks. Pregnancy is divided into three periods of growth, also called trimesters:  First trimester: 0-12 weeks.  Second trimester: 13-27 weeks.  Third trimester: 28-40 weeks. The day when your baby is ready to be born (full term) is your estimated date of delivery. How does my baby develop month by month? First month  The fertilized egg attaches to the inside of the uterus.  Some cells will form the placenta. Others will form the fetus.  The arms, legs, brain, spinal cord, lungs, and heart begin to develop.  At the end of the first month, the heart begins to beat. Second month  The bones, inner ear, eyelids, hands, and feet form.  The genitals develop.  By the end of 8 weeks, all major organs are developing. Third month  All of the internal organs are forming.  Teeth develop below the gums.  Bones and muscles begin to grow. The spine can flex.  The skin is transparent.  Fingernails and toenails begin to form.  Arms and legs continue to grow longer, and hands and feet develop.  The fetus is about 3 inches (7.6 cm) long. Fourth  month  The placenta is completely formed.  The external sex organs, neck, outer ear, eyebrows, eyelids, and fingernails are formed.  The fetus can hear, swallow, and move its arms and legs.  The kidneys begin to produce urine.  The skin is covered with a white, waxy coating (vernix) and very fine hair (lanugo). Fifth month  The fetus moves around more and can be felt for the first time (quickening).  The fetus starts to sleep and wake up and may begin to suck its finger.  The nails grow to the end of the fingers.  The organ in the digestive system that makes bile (gallbladder) functions and helps to digest nutrients.  If your baby is a girl, eggs are present in her ovaries. If your baby is a boy, testicles start to move down into his scrotum. Sixth month  The lungs are formed.  The eyes open. The brain continues to develop.  Your baby has fingerprints and toe prints. Your baby's hair grows thicker.  At the end of the second trimester, the fetus is about 9 inches (22.9 cm) long. Seventh month  The fetus kicks and stretches.  The eyes are developed enough to sense changes in light.  The hands can make a grasping motion.  The fetus responds to sound. Eighth month  All organs and body systems are fully developed and functioning.  Bones harden, and taste buds develop. The fetus may hiccup.  Certain areas of the brain are still developing. The skull remains soft. Ninth month  The fetus gains about  lb (0.23 kg) each week.  The lungs are fully developed.  Patterns of sleep develop.  The fetus's head typically moves into a head-down position (vertex) in the uterus to prepare for birth.  The fetus weighs 6-9 lb (2.72-4.08 kg) and is 19-20 inches (48.26-50.8 cm) long. What can I do to have a healthy pregnancy and help my baby develop? General instructions  Take prenatal vitamins as directed by your health care provider. These include vitamins such as folic acid,  iron, calcium, and vitamin D. They are important for healthy development.  Take medicines only as directed by your health care provider. Read labels and ask a pharmacist or your health care provider whether over-the-counter medicines, supplements, and prescription drugs are safe to take during pregnancy.  Keep all follow-up visits as directed by your health care provider. This is important. Follow-up visits include prenatal care and screening tests. How do I know if my baby is developing well? At each prenatal visit, your health care provider will do several different tests to check on your health and keep track of your baby's development. These include:  Fundal height and position. ? Your health care provider will measure your growing belly from your pubic bone to the top of the uterus using a tape measure. ? Your health care provider will also feel your belly to determine your baby's position.  Heartbeat. ? An ultrasound in the first trimester can confirm pregnancy and show a heartbeat, depending on how far along you are. ? Your health care provider will check your baby's heart rate at every prenatal visit.  Second trimester ultrasound. ? This ultrasound checks your baby's development. It also may show your baby's gender. What should I do if I have concerns about my baby's development? Always talk with your health care provider about any concerns that you may have about your pregnancy and your baby. Summary  A pregnancy usually lasts 280 days, or about 40 weeks. Pregnancy is divided into three periods of growth, also called trimesters.  Your health care provider will monitor your baby's growth and development throughout your pregnancy.  Follow your health care provider's recommendations about taking prenatal vitamins and medicines during your pregnancy.  Talk with your health care provider if you have any concerns about your pregnancy or your developing baby. This information is not  intended to replace advice given to you by your health care provider. Make sure you discuss any questions you have with your health care provider. Document Released: 02/04/2008 Document Revised: 12/09/2018 Document Reviewed: 07/01/2017 Elsevier Patient Education  [place]. Morning Sickness  Morning sickness is when you feel sick to your stomach (nauseous) during pregnancy. You may feel sick to your stomach and throw up (vomit). You may feel sick in the morning, but you can feel this way at any time of day. Some women feel very sick to their stomach and cannot stop throwing up (hyperemesis gravidarum). Follow these instructions at home:  Medicines  Take over-the-counter and prescription medicines only as told by your doctor. Do not take any medicines until you talk with your doctor about them first.  Taking multivitamins before getting pregnant can stop or lessen the harshness of morning sickness. Eating and drinking  Eat dry toast or crackers before getting out of bed.  Eat 5 or 6 small meals a day.  Eat dry and bland foods like rice and baked potatoes.  Do not eat greasy, fatty, or spicy foods.  Have someone cook for you if the smell of food causes you to feel sick or throw up.  If you feel sick to your stomach after taking prenatal vitamins, take them at night or with a snack.  Eat protein when you need a snack. Nuts, yogurt, and cheese are good choices.  Drink fluids throughout the day.  Try ginger ale made with real ginger, ginger tea made from fresh grated ginger, or ginger candies. General instructions  Do not use any products that have nicotine or tobacco in them, such as cigarettes and e-cigarettes. If you need help quitting, ask your doctor.  Use an air purifier to keep the air in your house free of smells.  Get lots of fresh air.  Try to avoid smells that make you feel sick.  Try: ? Wearing a bracelet that is used for seasickness (acupressure  wristband). ? Going to a doctor who puts thin needles into certain body points (acupuncture) to improve how you feel. Contact a doctor if:  You need medicine to feel better.  You feel dizzy or light-headed.  You are losing weight. Get help right away if:  You feel very sick to your stomach and cannot stop throwing up.  You pass out (faint).  You have very bad pain in your belly. Summary  Morning sickness is when you feel sick to your stomach (nauseous) during pregnancy.  You may feel sick in the morning, but you can feel this way at any time of day.  Making some changes to what you eat may help your symptoms go away. This information is not intended to replace advice given to you by your health care provider. Make sure you discuss any questions you have with your health care provider. Document Released: 09/25/2004 Document Revised: 07/31/2017 Document Reviewed: 09/18/2016 Elsevier Patient Education  2020 [place] of Pregnancy  The first trimester of pregnancy is from week 1 until the end of week 13 (months 1 through 3). During this time, your baby will begin to develop inside you. At 6-8 weeks, the eyes and face are formed, and the heartbeat can be seen on ultrasound. At the end of 12 weeks, all the baby's organs are formed. Prenatal care is all the medical care you receive before the birth of your baby. Make sure you get good prenatal care and follow all of your doctor's instructions. Follow these instructions at home: Medicines  Take over-the-counter and prescription medicines only as told by your doctor. Some medicines are safe and some medicines are not safe during pregnancy.  Take a prenatal vitamin that contains at least 600 micrograms (mcg) of folic acid.  If you have trouble pooping (constipation), take medicine that will make your stool soft (stool softener) if your doctor approves. Eating and drinking   Eat regular, healthy meals.  Your doctor  will tell you the amount of weight gain that is right for you.  Avoid raw meat and uncooked cheese.  If you feel sick to  your stomach (nauseous) or throw up (vomit): ? Eat 4 or 5 small meals a day instead of 3 large meals. ? Try eating a few soda crackers. ? Drink liquids between meals instead of during meals.  To prevent constipation: ? Eat foods that are high in fiber, like fresh fruits and vegetables, whole grains, and beans. ? Drink enough fluids to keep your pee (urine) clear or pale yellow. Activity  Exercise only as told by your doctor. Stop exercising if you have cramps or pain in your lower belly (abdomen) or low back.  Do not exercise if it is too hot, too humid, or if you are in a place of great height (high altitude).  Try to avoid standing for long periods of time. Move your legs often if you must stand in one place for a long time.  Avoid heavy lifting.  Wear low-heeled shoes. Sit and stand up straight.  You can have sex unless your doctor tells you not to. Relieving pain and discomfort  Wear a good support bra if your breasts are sore.  Take warm water baths (sitz baths) to soothe pain or discomfort caused by hemorrhoids. Use hemorrhoid cream if your doctor says it is okay.  Rest with your legs raised if you have leg cramps or low back pain.  If you have puffy, bulging veins (varicose veins) in your legs: ? Wear support hose or compression stockings as told by your doctor. ? Raise (elevate) your feet for 15 minutes, 3-4 times a day. ? Limit salt in your food. Prenatal care  Schedule your prenatal visits by the twelfth week of pregnancy.  Write down your questions. Take them to your prenatal visits.  Keep all your prenatal visits as told by your doctor. This is important. Safety  Wear your seat belt at all times when driving.  Make a list of emergency phone numbers. The list should include numbers for family, friends, the hospital, and police and fire  departments. General instructions  Ask your doctor for a referral to a local prenatal class. Begin classes no later than at the start of month 6 of your pregnancy.  Ask for help if you need counseling or if you need help with nutrition. Your doctor can give you advice or tell you where to go for help.  Do not use hot tubs, steam rooms, or saunas.  Do not douche or use tampons or scented sanitary pads.  Do not cross your legs for long periods of time.  Avoid all herbs and alcohol. Avoid drugs that are not approved by your doctor.  Do not use any tobacco products, including cigarettes, chewing tobacco, and electronic cigarettes. If you need help quitting, ask your doctor. You may get counseling or other support to help you quit.  Avoid cat litter boxes and soil used by cats. These carry germs that can cause birth defects in the baby and can cause a loss of your baby (miscarriage) or stillbirth.  Visit your dentist. At home, brush your teeth with a soft toothbrush. Be gentle when you floss. Contact a doctor if:  You are dizzy.  You have mild cramps or pressure in your lower belly.  You have a nagging pain in your belly area.  You continue to feel sick to your stomach, you throw up, or you have watery poop (diarrhea).  You have a bad smelling fluid coming from your vagina.  You have pain when you pee (urinate).  You have increased puffiness (swelling) in your  face, hands, legs, or ankles. Get help right away if:  You have a fever.  You are leaking fluid from your vagina.  You have spotting or bleeding from your vagina.  You have very bad belly cramping or pain.  You gain or lose weight rapidly.  You throw up blood. It may look like coffee grounds.  You are around people who have [place] measles, fifth disease, or chickenpox.  You have a very bad headache.  You have shortness of breath.  You have any kind of trauma, such as from a fall or a car  accident. Summary  The first trimester of pregnancy is from week 1 until the end of week 13 (months 1 through 3).  To take care of yourself and your unborn baby, you will need to eat healthy meals, take medicines only if your doctor tells you to do so, and do activities that are safe for you and your baby.  Keep all follow-up visits as told by your doctor. This is important as your doctor will have to ensure that your baby is healthy and growing well. This information is not intended to replace advice given to you by your health care provider. Make sure you discuss any questions you have with your health care provider. Document Released: 02/04/2008 Document Revised: 12/09/2018 Document Reviewed: 08/26/2016 Elsevier Patient Education  2020 [employer]. Commonly Asked Questions During Pregnancy  Cats: A parasite can be excreted in cat feces.  To avoid exposure you need to have another person empty the little box.  If you must empty the litter box you will need to wear gloves.  Wash your hands after handling your cat.  This parasite can also be found in raw or undercooked meat so this should also be avoided.  Colds, Sore Throats, Flu: Please check your medication sheet to see what you can take for symptoms.  If your symptoms are unrelieved by these medications please call the office.  Dental Work: Most any dental work [employer] recommends is permitted.  X-rays should only be taken during the first trimester if absolutely necessary.  Your abdomen should be shielded with a lead apron during all x-rays.  Please notify your provider prior to receiving any x-rays.  Novocaine is fine; gas is not recommended.  If your dentist requires a note from [place] prior to dental work please call the office and we will provide one for you.  Exercise: Exercise is an important part of staying healthy during your pregnancy.  You may continue most exercises you were accustomed to prior to pregnancy.  Later in your  pregnancy you will most likely notice you have difficulty with activities requiring balance like riding a bicycle.  It is important that you listen to your body and avoid activities that put you at a higher risk of falling.  Adequate rest and staying well hydrated are a must!  If you have questions about the safety of specific activities ask your provider.    Exposure to Children with illness: Try to avoid obvious exposure; report any symptoms to [place] when noted,  If you have chicken pos, red measles or mumps, you should be immune to these diseases.   Please do not take any vaccines while pregnant unless you have checked with your OB provider.  Fetal Movement: After 28 weeks we recommend you do "kick counts" twice daily.  Lie or sit down in a calm quiet environment and count your baby movements "kicks".  You should feel your baby  should feel your baby at least 10 times per hour.  If you have not felt 10 kicks within the first hour get up, walk around and have something sweet to eat or drink then repeat for an additional hour.  If count remains less than 10 per hour notify your provider.  Fumigating: Follow your pest control agent's advice as to how long to stay out of your home.  Ventilate the area well before re-entering.  Hemorrhoids:   Most over-the-counter preparations can be used during pregnancy.  Check your medication to see what is safe to use.  It is important to use a stool softener or fiber in your diet and to drink lots of liquids.  If hemorrhoids seem to be getting worse please call the office.   Hot Tubs:  Hot tubs Jacuzzis and saunas are not recommended while pregnant.  These increase your internal body temperature and should be avoided.  Intercourse:  Sexual intercourse is safe during pregnancy as long as you are comfortable, unless otherwise advised by your provider.  Spotting may occur after intercourse; report any bright red bleeding that is heavier than spotting.  Labor:  If you know that you are in labor,  please go to the hospital.  If you are unsure, please call the office and let us help you decide what to do.  Lifting, straining, etc:  If your job requires heavy lifting or straining please check with your provider for any limitations.  Generally, you should not lift items heavier than that you can lift simply with your hands and arms (no back muscles)  Painting:  Paint fumes do not harm your pregnancy, but may make you ill and should be avoided if possible.  Latex or water based paints have less odor than oils.  Use adequate ventilation while painting.  Permanents & Hair Color:  Chemicals in hair dyes are not recommended as they cause increase hair dryness which can increase hair loss during pregnancy.  " Highlighting" and permanents are allowed.  Dye may be absorbed differently and permanents may not hold as well during pregnancy.  Sunbathing:  Use a sunscreen, as skin burns easily during pregnancy.  Drink plenty of fluids; avoid over heating.  Tanning Beds:  Because their possible side effects are still unknown, tanning beds are not recommended.  Ultrasound Scans:  Routine ultrasounds are performed at approximately 20 weeks.  You will be able to see your baby's general anatomy an if you would like to know the gender this can usually be determined as well.  If it is questionable when you conceived you may also receive an ultrasound early in your pregnancy for dating purposes.  Otherwise ultrasound exams are not routinely performed unless there is a medical necessity.  Although you can request a scan we ask that you pay for it when conducted because insurance does not cover " patient request" scans.  Work: If your pregnancy proceeds without complications you may work until your due date, unless your physician or employer advises otherwise.  Round Ligament Pain/Pelvic Discomfort:  Sharp, shooting pains not associated with bleeding are fairly common, usually occurring in the second trimester of  pregnancy.  They tend to be worse when standing up or when you remain standing for long periods of time.  These are the result of pressure of certain pelvic ligaments called "round ligaments".  Rest, Tylenol and heat seem to be the most effective relief.  As the womb and fetus grow, they rise out of the pelvis   Please notify the office if your pain seems different than that described.  It may represent a more serious condition.  Common Medications Safe in Pregnancy  Acne:      Constipation:  Benzoyl Peroxide     Colace  Clindamycin      Dulcolax Suppository  Topica Erythromycin     Fibercon  Salicylic Acid      Metamucil         Miralax AVOID:        Senakot   Accutane    Cough:  Retin-A       Cough Drops  Tetracycline      Phenergan w/ Codeine if Rx  Minocycline      Robitussin (Plain & DM)  Antibiotics:     Crabs/Lice:  Ceclor       RID  Cephalosporins    AVOID:  E-Mycins      Kwell  Keflex  Macrobid/Macrodantin   Diarrhea:  Penicillin      Kao-Pectate  Zithromax      Imodium AD         PUSH FLUIDS AVOID:       Cipro     Fever:  Tetracycline      Tylenol (Regular or Extra  Minocycline       Strength)  Levaquin      Extra Strength-Do not          Exceed 8 tabs/24 hrs Caffeine:        <258m/day (equiv. To 1 cup of coffee or  approx. 3 12 oz sodas)         Gas: Cold/Hayfever:       Gas-X  Benadryl      Mylicon  Claritin       Phazyme  **Claritin-D        Chlor-Trimeton    Headaches:  Dimetapp      ASA-Free Excedrin  Drixoral-Non-Drowsy     Cold Compress  Mucinex (Guaifenasin)     Tylenol (Regular or Extra  Sudafed/Sudafed-12 Hour     Strength)  **Sudafed PE Pseudoephedrine   Tylenol Cold & Sinus     Vicks Vapor Rub  Zyrtec  **AVOID if Problems With Blood Pressure         Heartburn: Avoid lying down for at least 1 hour after meals  Aciphex      Maalox     Rash:  Milk of Magnesia     Benadryl    Mylanta       1% Hydrocortisone  Cream  Pepcid  Pepcid Complete   Sleep Aids:  Prevacid      Ambien   Prilosec       Benadryl  Rolaids       Chamomile Tea  Tums (Limit 4/day)     Unisom  Zantac       Tylenol PM         Warm milk-add vanilla or  Hemorrhoids:       Sugar for taste  Anusol/Anusol H.C.  (RX: Analapram 2.5%)  Sugar Substitutes:  Hydrocortisone OTC     Ok in moderation  Preparation H      Tucks        Vaseline lotion applied to tissue with wiping    Herpes:     Throat:  Acyclovir      Oragel  Famvir  Valtrex     Vaccines:         Flu Shot Leg Cramps:       *Gardasil  Benadryl      Hepatitis A         Hepatitis B Nasal Spray:       Pneumovax  Saline Nasal Spray     Polio Booster         Tetanus Nausea:       Tuberculosis test or PPD  Vitamin B6 25 mg TID   AVOID:    Dramamine      *Gardasil  Emetrol       Live Poliovirus  Ginger Root 250 mg QID    MMR (measles, mumps &  High Complex Carbs @ Bedtime    rebella)  Sea Bands-Accupressure    Varicella (Chickenpox)  Unisom 1/2 tab TID     *No known complications           If received before Pain:         Known pregnancy;   Darvocet       Resume series after  Lortab        Delivery  Percocet    Yeast:   Tramadol      Femstat  Tylenol 3      Gyne-lotrimin  Ultram       Monistat  Vicodin           MISC:         All Sunscreens           Hair Coloring/highlights          Insect Repellant's          (Including DEET)         Mystic Tans

## 2019-08-08 NOTE — Telephone Encounter (Signed)
Patient called to see if her provider could write a letter that states that she's pregnant and her due date? Please call patient

## 2019-08-09 ENCOUNTER — Other Ambulatory Visit: Payer: Self-pay

## 2019-08-09 LAB — HIV ANTIBODY (ROUTINE TESTING W REFLEX): HIV Screen 4th Generation wRfx: NONREACTIVE

## 2019-08-09 LAB — DRUG PROFILE, UR, 9 DRUGS (LABCORP)
Amphetamines, Urine: NEGATIVE ng/mL
Barbiturate Quant, Ur: NEGATIVE ng/mL
Benzodiazepine Quant, Ur: NEGATIVE ng/mL
Cannabinoid Quant, Ur: NEGATIVE ng/mL
Cocaine (Metab.): NEGATIVE ng/mL
Methadone Screen, Urine: NEGATIVE ng/mL
Opiate Quant, Ur: NEGATIVE ng/mL
PCP Quant, Ur: NEGATIVE ng/mL
Propoxyphene: NEGATIVE ng/mL

## 2019-08-09 LAB — URINALYSIS, ROUTINE W REFLEX MICROSCOPIC
Bilirubin, UA: NEGATIVE
Glucose, UA: NEGATIVE
Ketones, UA: NEGATIVE
Nitrite, UA: POSITIVE — AB
Protein,UA: NEGATIVE
RBC, UA: NEGATIVE
Specific Gravity, UA: >=1.03 — AB (ref 1.005–1.030)
Urobilinogen, Ur: 0.2 mg/dL (ref 0.2–1.0)
pH, UA: 5.5 (ref 5.0–7.5)

## 2019-08-09 LAB — HGB SOLU + RFLX FRAC: Sickle Solubility Test - HGBRFX: NEGATIVE

## 2019-08-09 LAB — TOXOPLASMA ANTIBODIES- IGG AND  IGM
Toxoplasma Antibody- IgM: 11.3 AU/mL — ABNORMAL HIGH (ref 0.0–7.9)
Toxoplasma IgG Ratio: <3 IU/mL (ref 0.0–7.1)

## 2019-08-09 LAB — HEPATITIS B SURFACE ANTIGEN: Hepatitis B Surface Ag: NEGATIVE

## 2019-08-09 LAB — MICROSCOPIC EXAMINATION
Casts: NONE SEEN /lpf
Epithelial Cells (non renal): >10 /hpf — AB (ref 0–10)

## 2019-08-09 LAB — RUBELLA SCREEN: Rubella Antibodies, IGG: 3.59 index (ref >0.99)

## 2019-08-09 LAB — HEMOGLOBIN A1C
Est. average glucose Bld gHb Est-mCnc: 105 mg/dL
Hgb A1c MFr Bld: 5.3 % (ref 4.8–5.6)

## 2019-08-09 LAB — ABO AND RH: Rh Factor: POSITIVE

## 2019-08-09 LAB — ANTIBODY SCREEN: Antibody Screen: NEGATIVE

## 2019-08-09 LAB — GC/CHLAMYDIA PROBE AMP
Chlamydia trachomatis, NAA: NEGATIVE
Neisseria Gonorrhoeae by PCR: NEGATIVE

## 2019-08-09 LAB — VARICELLA ZOSTER ANTIBODY, IGG: Varicella zoster IgG: 663 index (ref >165)

## 2019-08-09 LAB — NICOTINE SCREEN, URINE: Cotinine Ql Scrn, Ur: NEGATIVE ng/mL

## 2019-08-09 LAB — RPR: RPR Ser Ql: NONREACTIVE

## 2019-08-09 LAB — TSH: TSH: 1.68 u[IU]/mL (ref 0.450–4.500)

## 2019-08-09 MED ORDER — NITROFURANTOIN MONOHYD MACRO 100 MG PO CAPS: 100.0000 mg | ORAL_CAPSULE | Freq: Two times a day (BID) | ORAL | 0 refills | Status: DC

## 2019-08-10 LAB — URINE CULTURE, OB REFLEX

## 2019-08-10 LAB — CULTURE, OB URINE

## 2019-08-12 ENCOUNTER — Other Ambulatory Visit: Payer: Self-pay | Admitting: Obstetrics and Gynecology

## 2019-08-12 ENCOUNTER — Telehealth: Payer: Self-pay

## 2019-08-12 DIAGNOSIS — B589 Toxoplasmosis, unspecified: Secondary | ICD-10-CM

## 2019-08-12 NOTE — Telephone Encounter (Signed)
Mychart message sent to patient.

## 2019-08-15 ENCOUNTER — Encounter: Payer: No Typology Code available for payment source | Admitting: Certified Nurse Midwife

## 2019-08-22 ENCOUNTER — Other Ambulatory Visit: Payer: Self-pay

## 2019-08-22 ENCOUNTER — Ambulatory Visit
Admission: RE | Admit: 2019-08-22 | Discharge: 2019-08-22 | Disposition: A | Discharge status: Home / Self Care | Payer: Medicaid Other | Source: Ambulatory Visit | Attending: Maternal & Fetal Medicine | Admitting: Maternal & Fetal Medicine

## 2019-08-22 VITALS — BP 117/70 | HR 95 | Temp 98.4°F | Wt 257.0 lb

## 2019-08-22 DIAGNOSIS — Z3A10 10 weeks gestation of pregnancy: Secondary | ICD-10-CM | POA: Insufficient documentation

## 2019-08-22 DIAGNOSIS — R899 Unspecified abnormal finding in specimens from other organs, systems and tissues: Secondary | ICD-10-CM

## 2019-08-22 DIAGNOSIS — B589 Toxoplasmosis, unspecified: Secondary | ICD-10-CM | POA: Diagnosis not present

## 2019-08-22 DIAGNOSIS — O99511 Diseases of the respiratory system complicating pregnancy, first trimester: Secondary | ICD-10-CM | POA: Diagnosis not present

## 2019-08-22 DIAGNOSIS — O98611 Protozoal diseases complicating pregnancy, first trimester: Secondary | ICD-10-CM | POA: Insufficient documentation

## 2019-08-22 DIAGNOSIS — J45909 Unspecified asthma, uncomplicated: Secondary | ICD-10-CM | POA: Insufficient documentation

## 2019-08-22 NOTE — Progress Notes (Signed)
[place] Consultation   Chief Complaint: "I have toxoplasmosis."  HPI: Ms. Lisa Hogan is a 23 y.o. G1P0 at [redacted]w[redacted]d by by [redacted]w[redacted]d US performed 07/26/2019 who presents in consultation from Dr. 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  The patient denies any infectious symptoms and denies any exposure to cats or ingestion of raw meat.   Gynecologic History:  Patient's last menstrual period was 06/07/2019 (exact date).   Past Medical History: Patient  has a past medical history of Anxiety and Asthma.   Past Surgical History: She  has a past surgical history that includes No past surgeries.   Medications:  PN vitamins   Allergies: Patient is allergic to shellfish allergy.   Social History: Patient  reports that she has never smoked. She has never used smokeless tobacco. She reports previous alcohol use. She reports that she does not use drugs. Lives with her parents.  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    Family History: family history includes Breast cancer in her maternal grandmother; Cancer in her maternal grandmother; Seizures in her mother.   Review of Systems Except for constipation, a full 12 point review of systems was negative.   Physical Exam: BP 117/70   Pulse 95   Temp 98.4 F (36.9 C)   Wt 116.6 kg   LMP 06/07/2019 (Exact Date)   BMI 37.95 kg/m   FHR 142 per RN  Asessement: [redacted]w[redacted]d gestation Abnormal toxoplasmosis serology  Recommendations: Given that there can be false positive serologies with toxoplasmosis, we would recommend confirmatory testing at our national reference laboratory, which is at the [facility], in [place], [place].  Blood was drawn and will be sent from here today.  Testing results will be coordinated by our genetic counselor, Lisa Hogan.  In addition, we would recommend the patient return in 7 weeks for  detailed anatomy US.   She may take Colace or its generic once or twice a day for constipation.    Total time spent with the patient was 40 minutes with greater than 50% spent in counseling and coordination of care.  We appreciate this interesting consult and will be happy to be involved in the ongoing care of Ms. Lisa Hogan in anyway her obstetricians desire.  Erasmo Score, MD [facility]

## 2019-08-31 ENCOUNTER — Encounter: Payer: Self-pay | Admitting: Certified Nurse Midwife

## 2019-08-31 ENCOUNTER — Other Ambulatory Visit: Payer: Self-pay

## 2019-08-31 ENCOUNTER — Other Ambulatory Visit (HOSPITAL_COMMUNITY)
Admission: RE | Admit: 2019-08-31 | Discharge: 2019-08-31 | Disposition: A | Discharge status: Home / Self Care | Payer: Medicaid Other | Source: Ambulatory Visit | Attending: Certified Nurse Midwife | Admitting: Certified Nurse Midwife

## 2019-08-31 ENCOUNTER — Ambulatory Visit (INDEPENDENT_AMBULATORY_CARE_PROVIDER_SITE_OTHER): Payer: No Typology Code available for payment source | Admitting: Certified Nurse Midwife

## 2019-08-31 VITALS — BP 121/78 | HR 97 | Wt 255.6 lb

## 2019-08-31 DIAGNOSIS — Z3401 Encounter for supervision of normal first pregnancy, first trimester: Secondary | ICD-10-CM

## 2019-08-31 NOTE — Patient Instructions (Signed)

## 2019-08-31 NOTE — Progress Notes (Signed)
NEW OB HISTORY AND PHYSICAL  SUBJECTIVE:       Lisa Hogan is a 23 y.o. G1P0 female, Patient's last menstrual period was 06/07/2019 (exact date)., Estimated Date of Delivery: 03/13/20, [redacted]w[redacted]d, presents today for establishment of Prenatal Care. She has no unusual complaints and complains.    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  Gynecologic History Patient's last menstrual period was 06/07/2019 (exact date). Normal Contraception: none Last Pap: 2017  Results were: normal  Obstetric History OB History  Gravida Para Term Preterm AB Living  1            SAB TAB Ectopic Multiple Live Births               # Outcome Date GA Lbr Len/2nd Weight Sex Delivery Anes PTL Lv  1 Current             Past Medical History:  Diagnosis Date  . Anxiety   . Asthma     Past Surgical History:  Procedure Laterality Date  . NO PAST SURGERIES      Current Outpatient Medications on File Prior to Visit  Medication Sig Dispense Refill  . albuterol (PROVENTIL HFA;VENTOLIN HFA) 108 (90 BASE) MCG/ACT inhaler Inhale 2 puffs into the lungs every 6 (six) hours as needed for wheezing or shortness of breath.    . EPINEPHrine 0.3 mg/0.3 mL IJ SOAJ injection Inject into the muscle.    . Prenatal Vit-Fe Fumarate-FA (MULTIVITAMIN-PRENATAL) 27-0.8 MG TABS tablet Take 1 tablet by mouth daily at 12 noon.     No current facility-administered medications on file prior to visit.    Allergies  Allergen Reactions  . Shellfish Allergy Anaphylaxis    Social History   Socioeconomic History  . Marital status: Single    Spouse name: Not on file  . Number of children: Not on file  . Years of education: Not on file  . Highest education level: Not on file  Occupational History  . Occupation: Security  Tobacco Use  . Smoking status: Never Smoker  . Smokeless tobacco: Never Used  Substance and Sexual Activity  . Alcohol  use: Not Currently    Comment: occasionally  . Drug use: No  . Sexual activity: Yes    Birth control/protection: None  Other Topics Concern  . Not on file  Social History Narrative  . Not on file   Social Determinants of Health   Financial Resource Strain:   . Difficulty of Paying Living Expenses: Not on file  Food Insecurity:   . Worried About Charity fundraiser in the Last Year: Not on file  . Ran Out of Food in the Last Year: Not on file  Transportation Needs:   . Lack of Transportation (Medical): Not on file  . Lack of Transportation (Non-Medical): Not on file  Physical Activity:   . Days of Exercise per Week: Not on file  . Minutes of Exercise per Session: Not on file  Stress:   . Feeling of Stress : Not on file  Social Connections:   . Frequency of Communication with Friends and Family: Not on file  . Frequency of Social Gatherings with Friends and Family: Not on file  . Attends Religious Services: Not on file  . Active Member of Clubs or Organizations: Not on file  . Attends Archivist Meetings: Not on file  . Marital Status: Not  on file  Intimate Partner Violence:   . Fear of Current or Ex-Partner: Not on file  . Emotionally Abused: Not on file  . Physically Abused: Not on file  . Sexually Abused: Not on file    Family History  Problem Relation Age of Onset  . Seizures Mother   . Breast cancer Maternal Grandmother   . Cancer Maternal Grandmother     The following portions of the patient's history were reviewed and updated as appropriate: allergies, current medications, past OB history, past medical history, past surgical history, past family history, past social history, and problem list.   OBJECTIVE: Initial Physical Exam (New OB)  GENERAL APPEARANCE: alert, well appearing, in no apparent distress, oriented to person, place and time, overweight HEAD: normocephalic, atraumatic MOUTH: mucous membranes moist, pharynx normal without  lesions THYROID: no thyromegaly or masses present BREASTS: no masses noted, no significant tenderness, no palpable axillary nodes, no skin changes LUNGS: clear to auscultation, no wheezes, rales or rhonchi, symmetric air entry HEART: regular rate and rhythm, no murmurs ABDOMEN: soft, nontender, nondistended, no abnormal masses, no epigastric pain, obese and FHT present EXTREMITIES: no redness or tenderness in the calves or thighs, no edema, no limitation in range of motion, intact peripheral pulses SKIN: normal coloration and turgor, no rashes LYMPH NODES: no adenopathy palpable NEUROLOGIC: alert, oriented, normal speech, no focal findings or movement disorder noted  PELVIC EXAM EXTERNAL GENITALIA: normal appearing vulva with no masses, tenderness or lesions VAGINA: no abnormal discharge or lesions CERVIX: no lesions or cervical motion tenderness UTERUS: gravid ADNEXA: no masses palpable and nontender OB EXAM PELVIMETRY: appears adequate RECTUM: exam not indicated  ASSESSMENT: Normal pregnancy  PLAN: New OB counseling: The patient has been given an overview regarding routine prenatal care. Recommendations regarding diet, weight gain, and exercise in pregnancy were given. Prenatal testing, optional genetic testing, carrier screening test, and ultrasound use in pregnancy were reviewed.  Plans panorama today . Benefits of Breast Feeding were discussed. The patient is encouraged to consider nursing her baby post partum.   Doreene Burke, CNM

## 2019-09-01 LAB — CBC
Hematocrit: 36.5 % (ref 34.0–46.6)
Hemoglobin: 12.2 g/dL (ref 11.1–15.9)
MCH: 28.3 pg (ref 26.6–33.0)
MCHC: 33.4 g/dL (ref 31.5–35.7)
MCV: 85 fL (ref 79–97)
Platelets: 251 10*3/uL (ref 150–450)
RBC: 4.31 x10E6/uL (ref 3.77–5.28)
RDW: 13.3 % (ref 11.7–15.4)
WBC: 9 10*3/uL (ref 3.4–10.8)

## 2019-09-02 NOTE — L&D Delivery Note (Signed)
       Delivery Note   Lisa Hogan is a 24 y.o. G1P0 at [redacted]w[redacted]d Estimated Date of Delivery: 03/13/20  PRE-OPERATIVE DIAGNOSIS:  1) [redacted]w[redacted]d pregnancy.    POST-OPERATIVE DIAGNOSIS:  1) [redacted]w[redacted]d pregnancy s/p Vaginal, Spontaneous   Delivery Type: Vaginal, Spontaneous    Delivery Anesthesia: Epidural   Labor Complications:  none    ESTIMATED BLOOD LOSS: 250  ml    FINDINGS:   1) female infant, Apgar scores of 8   at 1 minute and 9   at 5 minutes and a birthweight of   ounces.    2) Nuchal cord: no  SPECIMENS:   PLACENTA:   Appearance: Intact , 3 vessel cord, cord blood sample collected   Removal: Spontaneous      Disposition:  per protocol  DISPOSITION:  Infant to left in stable condition in the delivery room, with L&D personnel and mother,  NARRATIVE SUMMARY: Labor course:  Lisa Hogan is a G1P0 at [redacted]w[redacted]d who presented for induction of labor.  She progressed well in labor with one dose of cytotec.  She received the appropriate epidural anesthesia and proceeded to complete dilation. She evidenced good maternal expulsive effort during the second stage. She went on to deliver a viable female infant "Lisa Hogan" in OA , head rotated to ROT for delivery of shoulders. The placenta delivered without problems and was noted to be complete. A perineal and vaginal examination was performed. Lacerations: None , perineal skid mark, hemostatic not in need of repair. The patient tolerated this well.  Doreene Burke, CNM . 03/10/2020 8:45 AM

## 2019-09-05 ENCOUNTER — Telehealth: Payer: Self-pay | Admitting: Certified Nurse Midwife

## 2019-09-05 ENCOUNTER — Other Ambulatory Visit: Payer: Self-pay | Admitting: Certified Nurse Midwife

## 2019-09-05 LAB — CERVICOVAGINAL ANCILLARY ONLY
Bacterial Vaginitis (gardnerella): POSITIVE — AB
Candida Glabrata: NEGATIVE
Candida Vaginitis: NEGATIVE
Comment: NEGATIVE
Comment: NEGATIVE
Comment: NEGATIVE

## 2019-09-05 MED ORDER — METRONIDAZOLE 0.75 % VA GEL: 1.0000 | Freq: Every day | VAGINAL | 0 refills | Status: AC

## 2019-09-05 NOTE — Telephone Encounter (Signed)
Pt called in asking if her mychart was her gender of her baby. Pt was told to it was just her results from here swab. That she has to come up here to get her test or the nurse will call.

## 2019-09-05 NOTE — Progress Notes (Signed)
Vaginal swab positive for BV, orders placed for treatment.   Ebony Yorio, CNM  

## 2019-09-08 ENCOUNTER — Telehealth: Payer: Self-pay | Admitting: Obstetrics and Gynecology

## 2019-09-08 LAB — CYTOLOGY - PAP: Diagnosis: NEGATIVE

## 2019-09-08 NOTE — Telephone Encounter (Signed)
Ms. Lisa Hogan was seen for an MFM consultation at [facility] on 08/22/2019 due to abnormal toxoplasmosis IgM testing at her OB office.  Dr. Dellia Nims met with the patient and ordered the Toxoplasmosis Pregnancy Panel through the [facility]. [facility] for [facility] in [place].  Results of this testing were negative, indicating that this pregnancy is not risk and no further testing is indicated.  The patient was informed of these results. We may be reached at [phone] with any questions or concerns.  Wilburt Finlay, MS, CGC

## 2019-09-12 NOTE — Telephone Encounter (Signed)
error 

## 2019-09-30 ENCOUNTER — Other Ambulatory Visit: Payer: No Typology Code available for payment source

## 2019-09-30 ENCOUNTER — Other Ambulatory Visit: Payer: Self-pay

## 2019-09-30 ENCOUNTER — Ambulatory Visit (INDEPENDENT_AMBULATORY_CARE_PROVIDER_SITE_OTHER): Payer: No Typology Code available for payment source | Admitting: Certified Nurse Midwife

## 2019-09-30 ENCOUNTER — Encounter: Payer: Self-pay | Admitting: Certified Nurse Midwife

## 2019-09-30 VITALS — BP 110/65 | HR 89 | Wt 255.3 lb

## 2019-09-30 DIAGNOSIS — Z3402 Encounter for supervision of normal first pregnancy, second trimester: Secondary | ICD-10-CM

## 2019-09-30 MED ORDER — ASPIRIN EC 81 MG PO TBEC: 81.0000 mg | DELAYED_RELEASE_TABLET | Freq: Every day | ORAL | 7 refills | Status: DC

## 2019-09-30 NOTE — Patient Instructions (Signed)

## 2019-09-30 NOTE — Progress Notes (Signed)
ROB doing well. Is unsure if she is feeling movement yet. Discussed round ligament pain . Self help measures reviewed. Information sheet given on belly band. Early glucose screen today. Discussed recommendation for aspirin daily. She verbalizes and agrees, orders placed.  Discussed anatomy u/s next visit. Follow up 4 wks with michelle.   Doreene Burke, CNM

## 2019-09-30 NOTE — Addendum Note (Signed)
Addended by: Brooke Dare on: 09/30/2019 02:53 PM   Modules accepted: Orders

## 2019-10-01 LAB — GLUCOSE, 1 HOUR GESTATIONAL: Gestational Diabetes Screen: 112 mg/dL (ref 65–139)

## 2019-10-10 ENCOUNTER — Other Ambulatory Visit: Payer: Self-pay | Admitting: Certified Nurse Midwife

## 2019-10-10 ENCOUNTER — Other Ambulatory Visit: Payer: Medicaid Other

## 2019-10-10 DIAGNOSIS — B589 Toxoplasmosis, unspecified: Secondary | ICD-10-CM

## 2019-10-13 ENCOUNTER — Other Ambulatory Visit: Payer: Self-pay

## 2019-10-13 ENCOUNTER — Ambulatory Visit
Admission: RE | Admit: 2019-10-13 | Discharge: 2019-10-13 | Disposition: A | Discharge status: Home / Self Care | Payer: Medicaid Other | Source: Ambulatory Visit | Attending: Obstetrics and Gynecology | Admitting: Obstetrics and Gynecology

## 2019-10-13 DIAGNOSIS — B589 Toxoplasmosis, unspecified: Secondary | ICD-10-CM

## 2019-10-13 DIAGNOSIS — E669 Obesity, unspecified: Secondary | ICD-10-CM | POA: Insufficient documentation

## 2019-10-13 DIAGNOSIS — O99212 Obesity complicating pregnancy, second trimester: Secondary | ICD-10-CM | POA: Diagnosis not present

## 2019-10-13 DIAGNOSIS — B529 Plasmodium malariae malaria without complication: Secondary | ICD-10-CM | POA: Insufficient documentation

## 2019-10-13 DIAGNOSIS — O98619 Protozoal diseases complicating pregnancy, unspecified trimester: Secondary | ICD-10-CM | POA: Insufficient documentation

## 2019-10-13 DIAGNOSIS — Z3A18 18 weeks gestation of pregnancy: Secondary | ICD-10-CM | POA: Insufficient documentation

## 2019-11-03 ENCOUNTER — Other Ambulatory Visit: Payer: Self-pay

## 2019-11-03 ENCOUNTER — Ambulatory Visit (INDEPENDENT_AMBULATORY_CARE_PROVIDER_SITE_OTHER): Payer: No Typology Code available for payment source | Admitting: Certified Nurse Midwife

## 2019-11-03 ENCOUNTER — Ambulatory Visit (INDEPENDENT_AMBULATORY_CARE_PROVIDER_SITE_OTHER): Payer: No Typology Code available for payment source

## 2019-11-03 VITALS — BP 118/76 | HR 90 | Wt 258.1 lb

## 2019-11-03 DIAGNOSIS — Z363 Encounter for antenatal screening for malformations: Secondary | ICD-10-CM | POA: Diagnosis not present

## 2019-11-03 DIAGNOSIS — Z3402 Encounter for supervision of normal first pregnancy, second trimester: Secondary | ICD-10-CM

## 2019-11-03 DIAGNOSIS — Z3A21 21 weeks gestation of pregnancy: Secondary | ICD-10-CM | POA: Diagnosis not present

## 2019-11-03 NOTE — Progress Notes (Signed)
ROB-Doing well. Answered questions regarding Childbirth classes and COVID restrictions. Anatomy scan complete and normal, see below. Anticipatory guidance regarding course of prenatal care. Reviewed red flag symptoms and when to call. RTC x 4 weeks for ROB or sooner if needed.   ULTRASOUND REPORT  Location: [facility] Date of Service: 11/03/2019   Indications:Anatomy Ultrasound Findings:  Singleton intrauterine pregnancy is visualized with FHR at 146 BPM. Biometrics give an (U/S) Gestational age of [redacted]w[redacted]d and an (U/S) EDD of 03/12/2020; this correlates with the clinically established Estimated Date of Delivery: 03/13/20  Fetal presentation is Variable.  EFW: 421 g ( 16 oz ). Fetal Percentile  Placenta: anterior. Grade: 1 AFI: subjectively normal.  Anatomic survey is complete and normal; Gender - female.    Right Ovary is normal in appearance. Left Ovary is normal appearance. Survey of the adnexa demonstrates no adnexal masses. There is no free peritoneal fluid in the cul de sac.  Impression: 1. [redacted]w[redacted]d Viable Singleton Intrauterine pregnancy by U/S. 2. (U/S) EDD is consistent with Clinically established Estimated Date of Delivery: 03/13/20 . 3. Normal Anatomy Scan  Recommendations: 1.Clinical correlation with the patient's History and Physical Exam.

## 2019-11-03 NOTE — Patient Instructions (Signed)
WHAT OB PATIENTS CAN EXPECT   Confirmation of pregnancy and ultrasound ordered if medically indicated-[redacted] weeks gestation  New OB (NOB) intake with nurse and New OB (NOB) labs- [redacted] weeks gestation  New OB (NOB) physical examination with provider- 11/[redacted] weeks gestation  Flu vaccine-[redacted] weeks gestation  Anatomy scan-[redacted] weeks gestation  Glucose tolerance test, blood work to test for anemia, T-dap vaccine-[redacted] weeks gestation  Vaginal swabs/cultures-STD/Group B strep-[redacted] weeks gestation  Appointments every 4 weeks until 28 weeks  Every 2 weeks from 28 weeks until 36 weeks  Weekly visits from 36 weeks until delivery  Second Trimester of Pregnancy  The second trimester is from week 14 through week 27 (month 4 through 6). This is often the time in pregnancy that you feel your best. Often times, morning sickness has lessened or quit. You may have more energy, and you may get hungry more often. Your unborn baby is growing rapidly. At the end of the sixth month, he or she is about 9 inches long and weighs about 1 pounds. You will likely feel the baby move between 18 and 20 weeks of pregnancy. Follow these instructions at home: Medicines  Take over-the-counter and prescription medicines only as told by your doctor. Some medicines are safe and some medicines are not safe during pregnancy.  Take a prenatal vitamin that contains at least 600 micrograms (mcg) of folic acid.  If you have trouble pooping (constipation), take medicine that will make your stool soft (stool softener) if your doctor approves. Eating and drinking   Eat regular, healthy meals.  Avoid raw meat and uncooked cheese.  If you get low calcium from the food you eat, talk to your doctor about taking a daily calcium supplement.  Avoid foods that are high in fat and sugars, such as fried and sweet foods.  If you feel sick to your stomach (nauseous) or throw up (vomit): ? Eat 4 or 5 small meals a day instead of 3 large  meals. ? Try eating a few soda crackers. ? Drink liquids between meals instead of during meals.  To prevent constipation: ? Eat foods that are high in fiber, like fresh fruits and vegetables, whole grains, and beans. ? Drink enough fluids to keep your pee (urine) clear or pale yellow. Activity  Exercise only as told by your doctor. Stop exercising if you start to have cramps.  Do not exercise if it is too hot, too humid, or if you are in a place of great height (high altitude).  Avoid heavy lifting.  Wear low-heeled shoes. Sit and stand up straight.  You can continue to have sex unless your doctor tells you not to. Relieving pain and discomfort  Wear a good support bra if your breasts are tender.  Take warm water baths (sitz baths) to soothe pain or discomfort caused by hemorrhoids. Use hemorrhoid cream if your doctor approves.  Rest with your legs raised if you have leg cramps or low back pain.  If you develop puffy, bulging veins (varicose veins) in your legs: ? Wear support hose or compression stockings as told by your doctor. ? Raise (elevate) your feet for 15 minutes, 3-4 times a day. ? Limit salt in your food. Prenatal care  Write down your questions. Take them to your prenatal visits.  Keep all your prenatal visits as told by your doctor. This is important. Safety  Wear your seat belt when driving.  Make a list of emergency phone numbers, including numbers for family, friends, the  hospital, and police and fire departments. General instructions  Ask your doctor about the right foods to eat or for help finding a counselor, if you need these services.  Ask your doctor about local prenatal classes. Begin classes before month 6 of your pregnancy.  Do not use hot tubs, steam rooms, or saunas.  Do not douche or use tampons or scented sanitary pads.  Do not cross your legs for long periods of time.  Visit your dentist if you have not done so. Use a soft toothbrush  to brush your teeth. Floss gently.  Avoid all smoking, herbs, and alcohol. Avoid drugs that are not approved by your doctor.  Do not use any products that contain nicotine or tobacco, such as cigarettes and e-cigarettes. If you need help quitting, ask your doctor.  Avoid cat litter boxes and soil used by cats. These carry germs that can cause birth defects in the baby and can cause a loss of your baby (miscarriage) or stillbirth. Contact a doctor if:  You have mild cramps or pressure in your lower belly.  You have pain when you pee (urinate).  You have bad smelling fluid coming from your vagina.  You continue to feel sick to your stomach (nauseous), throw up (vomit), or have watery poop (diarrhea).  You have a nagging pain in your belly area.  You feel dizzy. Get help right away if:  You have a fever.  You are leaking fluid from your vagina.  You have spotting or bleeding from your vagina.  You have severe belly cramping or pain.  You lose or gain weight rapidly.  You have trouble catching your breath and have chest pain.  You notice sudden or extreme puffiness (swelling) of your face, hands, ankles, feet, or legs.  You have not felt the baby move in over an hour.  You have severe headaches that do not go away when you take medicine.  You have trouble seeing. Summary  The second trimester is from week 14 through week 27 (months 4 through 6). This is often the time in pregnancy that you feel your best.  To take care of yourself and your unborn baby, you will need to eat healthy meals, take medicines only if your doctor tells you to do so, and do activities that are safe for you and your baby.  Call your doctor if you get sick or if you notice anything unusual about your pregnancy. Also, call your doctor if you need help with the right food to eat, or if you want to know what activities are safe for you. This information is not intended to replace advice given to you by  your health care provider. Make sure you discuss any questions you have with your health care provider. Document Revised: 12/10/2018 Document Reviewed: 09/23/2016 Elsevier Patient Education  2020 [employer] Round Ligament Pain  The round ligament is a cord of muscle and tissue that helps support the uterus. It can become a source of pain during pregnancy if it becomes stretched or twisted as the baby grows. The pain usually begins in the second trimester (13-28 weeks) of pregnancy, and it can come and go until the baby is delivered. It is not a serious problem, and it does not cause harm to the baby. Round ligament pain is usually a short, sharp, and pinching pain, but it can also be a dull, lingering, and aching pain. The pain is felt in the lower side of the abdomen or in the  groin. It usually starts deep in the groin and moves up to the outside of the hip area. The pain may occur when you:  Suddenly change position, such as quickly going from a sitting to standing position.  Roll over in bed.  Cough or sneeze.  Do physical activity. Follow these instructions at home:   Watch your condition for any changes.  When the pain starts, relax. Then try any of these methods to help with the pain: ? Sitting down. ? Flexing your knees up to your abdomen. ? Lying on your side with one pillow under your abdomen and another pillow between your legs. ? Sitting in a warm bath for 15-20 minutes or until the pain goes away.  Take over-the-counter and prescription medicines only as told by your health care provider.  Move slowly when you sit down or stand up.  Avoid long walks if they cause pain.  Stop or reduce your physical activities if they cause pain.  Keep all follow-up visits as told by your health care provider. This is important. Contact a health care provider if:  Your pain does not go away with treatment.  You feel pain in your back that you did not have before.  Your medicine  is not helping. Get help right away if:  You have a fever or chills.  You develop uterine contractions.  You have vaginal bleeding.  You have nausea or vomiting.  You have diarrhea.  You have pain when you urinate. Summary  Round ligament pain is felt in the lower abdomen or groin. It is usually a short, sharp, and pinching pain. It can also be a dull, lingering, and aching pain.  This pain usually begins in the second trimester (13-28 weeks). It occurs because the uterus is stretching with the growing baby, and it is not harmful to the baby.  You may notice the pain when you suddenly change position, when you cough or sneeze, or during physical activity.  Relaxing, flexing your knees to your abdomen, lying on one side, or taking a warm bath may help to get rid of the pain.  Get help from your health care provider if the pain does not go away or if you have vaginal bleeding, nausea, vomiting, diarrhea, or painful urination. This information is not intended to replace advice given to you by your health care provider. Make sure you discuss any questions you have with your health care provider. Document Revised: 02/03/2018 Document Reviewed: 02/03/2018 Elsevier Patient Education  [place].

## 2019-12-05 ENCOUNTER — Encounter: Payer: Self-pay | Admitting: Certified Nurse Midwife

## 2019-12-05 ENCOUNTER — Other Ambulatory Visit: Payer: Self-pay

## 2019-12-05 ENCOUNTER — Ambulatory Visit (INDEPENDENT_AMBULATORY_CARE_PROVIDER_SITE_OTHER): Payer: No Typology Code available for payment source | Admitting: Certified Nurse Midwife

## 2019-12-05 VITALS — BP 107/64 | HR 94 | Wt 261.2 lb

## 2019-12-05 DIAGNOSIS — Z3402 Encounter for supervision of normal first pregnancy, second trimester: Secondary | ICD-10-CM

## 2019-12-05 LAB — POCT URINALYSIS DIPSTICK OB
Bilirubin, UA: NEGATIVE
Blood, UA: NEGATIVE
Glucose, UA: NEGATIVE
Ketones, UA: NEGATIVE
Leukocytes, UA: NEGATIVE
Nitrite, UA: NEGATIVE
POC,PROTEIN,UA: NEGATIVE
Spec Grav, UA: 1.01 (ref 1.010–1.025)
Urobilinogen, UA: 0.2 E.U./dL
pH, UA: 5 (ref 5.0–8.0)

## 2019-12-05 NOTE — Progress Notes (Signed)
ROB doing well. Feels good movement. Discussed 28 wk labs /Tdap. She verbalizes and agrees to plan. Follow up 2 wks with Marcelino Duster.   Doreene Burke, CNM

## 2019-12-05 NOTE — Patient Instructions (Addendum)
Td (Tetanus, Diphtheria) Vaccine: What You Need to Know 1. Why get vaccinated? Td vaccine can prevent tetanus and diphtheria. Tetanus enters the body through cuts or wounds. Diphtheria spreads from person to person.  TETANUS (T) causes painful stiffening of the muscles. Tetanus can lead to serious health problems, including being unable to open the mouth, having trouble swallowing and breathing, or death.  DIPHTHERIA (D) can lead to difficulty breathing, heart failure, paralysis, or death. 2. Td vaccine Td is only for children 7 years and older, adolescents, and adults.  Td is usually given as a booster dose every 10 years, but it can also be given earlier after a severe and dirty wound or burn. Another vaccine, called Tdap, that protects against pertussis, also known as "whooping cough," in addition to tetanus and diphtheria, may be used instead of Td.  Td may be given at the same time as other vaccines. 3. Talk with your health care provider Tell your vaccine provider if the person getting the vaccine:  Has had an allergic reaction after a previous dose of any vaccine that protects against tetanus or diphtheria, or has any severe, life-threatening allergies.  Has ever had Guillain-Barr Syndrome (also called GBS).  Has had severe pain or swelling after a previous dose of any vaccine that protects against tetanus or diphtheria. In some cases, your health care provider may decide to postpone Td vaccination to a future visit.  People with minor illnesses, such as a cold, may be vaccinated. People who are moderately or severely ill should usually wait until they recover before getting Td vaccine.  Your health care provider can give you more information. 4. Risks of a vaccine reaction  Pain, redness, or swelling where the shot was given, mild fever, headache, feeling tired, and nausea, vomiting, diarrhea, or stomachache sometimes happen after Td vaccine. People sometimes faint after medical  procedures, including vaccination. Tell your provider if you feel dizzy or have vision changes or ringing in the ears.  As with any medicine, there is a very remote chance of a vaccine causing a severe allergic reaction, other serious injury, or death. 5. What if there is a serious problem? An allergic reaction could occur after the vaccinated person leaves the clinic. If you see signs of a severe allergic reaction (hives, swelling of the face and throat, difficulty breathing, a fast heartbeat, dizziness, or weakness), call 9-1-1 and get the person to the nearest hospital.  For other signs that concern you, call your health care provider.  Adverse reactions should be reported to the Vaccine Adverse Event Reporting System (VAERS). Your health care provider will usually file this report, or you can do it yourself. Visit the VAERS website at www.vaers.hhs.gov or call 1-800-822-7967. VAERS is only for reporting reactions, and VAERS staff do not give medical advice. 6. The National Vaccine Injury Compensation Program The National Vaccine Injury Compensation Program (VICP) is a federal program that was created to compensate people who may have been injured by certain vaccines. Visit the VICP website at www.hrsa.gov/vaccinecompensation or call 1-800-338-2382 to learn about the program and about filing a claim. There is a time limit to file a claim for compensation. 7. How can I learn more?  Ask your health care provider.  Call your local or state health department.  Contact the Centers for Disease Control and Prevention (CDC): ? Call 1-800-232-4636 (1-800-CDC-INFO) or ? Visit CDC's website at www.cdc.gov/vaccines Vaccine Information Statement Td Vaccine (12/01/18) This information is not intended to replace advice given   to you by your health care provider. Make sure you discuss any questions you have with your health care provider. Document Revised: 01/10/2019 Document Reviewed: 12/13/2018 Elsevier  Patient Education  2020 [employer] Glucose Tolerance Test During Pregnancy Why am I having this test? The glucose tolerance test (GTT) is done to check how your body processes sugar (glucose). This is one of several tests used to diagnose diabetes that develops during pregnancy (gestational diabetes mellitus). Gestational diabetes is a temporary form of diabetes that some women develop during pregnancy. It usually occurs during the second trimester of pregnancy and goes away after delivery. Testing (screening) for gestational diabetes usually occurs between 24 and 28 weeks of pregnancy. You may have the GTT test after having a 1-hour glucose screening test if the results from that test indicate that you may have gestational diabetes. You may also have this test if:  You have a history of gestational diabetes.  You have a history of giving birth to very large babies or have experienced repeated fetal loss (stillbirth).  You have signs and symptoms of diabetes, such as: ? Changes in your vision. ? Tingling or numbness in your hands or feet. ? Changes in hunger, thirst, and urination that are not otherwise explained by your pregnancy. What is being tested? This test measures the amount of glucose in your blood at different times during a period of 3 hours. This indicates how well your body is able to process glucose. What kind of sample is taken?  Blood samples are required for this test. They are usually collected by inserting a needle into a blood vessel. How do I prepare for this test?  For 3 days before your test, eat normally. Have plenty of carbohydrate-rich foods.  Follow instructions from your health care provider about: ? Eating or drinking restrictions on the day of the test. You may be asked to not eat or drink anything other than water (fast) starting 8-10 hours before the test. ? Changing or stopping your regular medicines. Some medicines may interfere with this test. Tell a  health care provider about:  All medicines you are taking, including vitamins, herbs, eye drops, creams, and over-the-counter medicines.  Any blood disorders you have.  Any surgeries you have had.  Any medical conditions you have. What happens during the test? First, your blood glucose will be measured. This is referred to as your fasting blood glucose, since you fasted before the test. Then, you will drink a glucose solution that contains a certain amount of glucose. Your blood glucose will be measured again 1, 2, and 3 hours after drinking the solution. This test takes about 3 hours to complete. You will need to stay at the testing location during this time. During the testing period:  Do not eat or drink anything other than the glucose solution.  Do not exercise.  Do not use any products that contain nicotine or tobacco, such as cigarettes and e-cigarettes. If you need help stopping, ask your health care provider. The testing procedure may vary among health care providers and hospitals. How are the results reported? Your results will be reported as milligrams of glucose per deciliter of blood (mg/dL) or millimoles per liter (mmol/L). Your health care provider will compare your results to normal ranges that were established after testing a large group of people (reference ranges). Reference ranges may vary among labs and hospitals. For this test, common reference ranges are:  Fasting: less than 95-105 mg/dL (5.3-5.8 mmol/L).  1 hour   after drinking glucose: less than 180-190 mg/dL (10.0-10.5 mmol/L).  2 hours after drinking glucose: less than 155-165 mg/dL (8.6-9.2 mmol/L).  3 hours after drinking glucose: 140-145 mg/dL (7.8-8.1 mmol/L). What do the results mean? Results within reference ranges are considered normal, meaning that your glucose levels are well-controlled. If two or more of your blood glucose levels are high, you may be diagnosed with gestational diabetes. If only one  level is high, your health care provider may suggest repeat testing or other tests to confirm a diagnosis. Talk with your health care provider about what your results mean. Questions to ask your health care provider Ask your health care provider, or the department that is doing the test:  When will my results be ready?  How will I get my results?  What are my treatment options?  What other tests do I need?  What are my next steps? Summary  The glucose tolerance test (GTT) is one of several tests used to diagnose diabetes that develops during pregnancy (gestational diabetes mellitus). Gestational diabetes is a temporary form of diabetes that some women develop during pregnancy.  You may have the GTT test after having a 1-hour glucose screening test if the results from that test indicate that you may have gestational diabetes. You may also have this test if you have any symptoms or risk factors for gestational diabetes.  Talk with your health care provider about what your results mean. This information is not intended to replace advice given to you by your health care provider. Make sure you discuss any questions you have with your health care provider. Document Revised: 12/09/2018 Document Reviewed: 03/30/2017 Elsevier Patient Education  2020 [employer]  

## 2019-12-22 ENCOUNTER — Other Ambulatory Visit: Payer: Self-pay

## 2019-12-22 ENCOUNTER — Other Ambulatory Visit: Payer: No Typology Code available for payment source

## 2019-12-22 ENCOUNTER — Ambulatory Visit (INDEPENDENT_AMBULATORY_CARE_PROVIDER_SITE_OTHER): Payer: No Typology Code available for payment source | Admitting: Certified Nurse Midwife

## 2019-12-22 VITALS — BP 99/69 | HR 80 | Wt 266.4 lb

## 2019-12-22 DIAGNOSIS — Z3A28 28 weeks gestation of pregnancy: Secondary | ICD-10-CM

## 2019-12-22 DIAGNOSIS — Z23 Encounter for immunization: Secondary | ICD-10-CM | POA: Diagnosis not present

## 2019-12-22 LAB — POCT URINALYSIS DIPSTICK OB
Bilirubin, UA: NEGATIVE
Blood, UA: NEGATIVE
Glucose, UA: NEGATIVE
Ketones, UA: NEGATIVE
Leukocytes, UA: NEGATIVE
Nitrite, UA: NEGATIVE
POC,PROTEIN,UA: NEGATIVE
Spec Grav, UA: >=1.03 — AB (ref 1.010–1.025)
Urobilinogen, UA: 0.2 E.U./dL
pH, UA: 5 (ref 5.0–8.0)

## 2019-12-22 MED ORDER — TETANUS-DIPHTH-ACELL PERTUSSIS 5-2.5-18.5 LF-MCG/0.5 IM SUSP
0.5000 mL | Freq: Once | INTRAMUSCULAR | Status: AC
  Administered 2019-12-22: 0.5 mL via INTRAMUSCULAR

## 2019-12-22 NOTE — Patient Instructions (Signed)
Pain Relief During Labor and Delivery Many things can cause pain during labor and delivery, including:  Pressure on bones and ligaments due to the baby moving through the pelvis.  Stretching of tissues due to the baby moving through the birth canal.  Muscle tension due to anxiety or nervousness.  The uterus tightening (contracting) and relaxing to help move the baby. There are many ways to deal with the pain of labor and delivery. They include:  Taking prenatal classes. Taking these classes helps you know what to expect during your baby's birth. What you learn will increase your confidence and decrease your anxiety.  Practicing relaxation techniques or doing relaxing activities, such as: ? Focused breathing. ? Meditation. ? Visualization. ? Aroma therapy. ? Listening to your favorite music. ? Hypnosis.  Taking a warm shower or bath (hydrotherapy). This may: ? Provide comfort and relaxation. ? Lessen your perception of pain. ? Decrease the amount of pain medicine needed. ? Decrease the length of labor.  Getting a massage or counterpressure on your back.  Applying warm packs or ice packs.  Changing positions often, moving around, or using a birthing ball.  Getting: ? Pain medicine through an IV or injection into a muscle. ? Pain medicine inserted into your spinal column. ? Injections of sterile water just under the skin on your lower back (intradermal injections). ? Laughing gas (nitrous oxide). Discuss your pain control options with your health care provider during your prenatal visits. Explore the options offered by your hospital or birth center. What kinds of medicine are available? There are two kinds of medicines that can be used to relieve pain during labor and delivery:  Analgesics. These medicines decrease pain without causing you to lose feeling or the ability to move your muscles.  Anesthetics. These medicines block feeling in the body and can decrease your  ability to move freely. Both of these kinds of medicine can cause minor side effects, such as nausea, trouble concentrating, and sleepiness. They can also decrease the baby's heart rate before birth and affect the baby's breathing rate after birth. For this reason, health care providers are careful about when and how much medicine is given. What are specific medicines and procedures that provide pain relief? Local Anesthetics Local anesthetics are used to numb a small area of the body. They may be used along with another kind of anesthetic or used to numb the nerves of the vagina, cervix, and perineum during the second stage of labor. General Anesthetics General anesthetics cause you to lose consciousness so you do not feel pain. They are usually only used for an emergency cesarean delivery. General anesthetics are given through an IV tube and a mask. Pudendal Block A pudendal block is a form of local anesthetic. It may be used to relieve the pain associated with pushing or stretching of the perineum at the time of delivery or to further numb the perineum. A pudendal block is done by injecting numbing medicine through the vaginal wall into a nerve in the pelvis. Epidural Analgesia Epidural analgesia is given through a flexible IV catheter that is inserted into the lower back. Numbing medicine is delivered continuously to the area near your spinal column nerves (epidural space). After having this type of analgesia, you may be able to move your legs but you most likely will not be able to walk. Depending on the amount of medicine given, you may lose all feeling in the lower half of your body, or you may retain some level   of sensation, including the urge to push. Epidural analgesia can be used to provide pain relief for a vaginal birth. Spinal Block A spinal block is similar to epidural analgesia, but the medicine is injected into the spinal fluid instead of the epidural space. A spinal block is only given  once. It starts to relieve pain quickly, but the pain relief lasts only 1-6 hours. Spinal blocks can be used for cesarean deliveries. Combined Spinal-Epidural (CSE) Block A CSE block combines the effects of a spinal block and epidural analgesia. The spinal block works quickly to block all pain. The epidural analgesia provides continuous pain relief, even after the effects of the spinal block have worn off. This information is not intended to replace advice given to you by your health care provider. Make sure you discuss any questions you have with your health care provider. Document Revised: 07/31/2017 Document Reviewed: 01/09/2016 Elsevier Patient Education  2020 [employer] Inc[employer]   Vaginal Delivery  Vaginal delivery means that you give birth by pushing your baby out of your birth canal (vagina). A team of health care providers will help you before, during, and after vaginal delivery. Birth experiences are unique for every woman and every pregnancy, and birth experiences vary depending on where you choose to give birth. What happens when I arrive at the birth center or hospital? Once you are in labor and have been admitted into the hospital or birth center, your health care provider may:  Review your pregnancy history and any concerns that you have.  Insert an IV into one of your veins. This may be used to give you fluids and medicines.  Check your blood pressure, pulse, temperature, and heart rate (vital signs).  Check whether your bag of water (amniotic sac) has broken (ruptured).  Talk with you about your birth plan and discuss pain control options. Monitoring Your health care provider may monitor your contractions (uterine monitoring) and your baby's heart rate (fetal monitoring). You may need to be monitored:  Often, but not continuously (intermittently).  All the time or for long periods at a time (continuously). Continuous monitoring may be needed if: ? You are taking certain  medicines, such as medicine to relieve pain or make your contractions stronger. ? You have pregnancy or labor complications. Monitoring may be done by:  Placing a special stethoscope or a handheld monitoring device on your abdomen to check your baby's heartbeat and to check for contractions.  Placing monitors on your abdomen (external monitors) to record your baby's heartbeat and the frequency and length of contractions.  Placing monitors inside your uterus through your vagina (internal monitors) to record your baby's heartbeat and the frequency, length, and strength of your contractions. Depending on the type of monitor, it may remain in your uterus or on your baby's head until birth.  Telemetry. This is a type of continuous monitoring that can be done with external or internal monitors. Instead of having to stay in bed, you are able to move around during telemetry. Physical exam Your health care provider may perform frequent physical exams. This may include:  Checking how and where your baby is positioned in your uterus.  Checking your cervix to determine: ? Whether it is thinning out (effacing). ? Whether it is opening up (dilating). What happens during labor and delivery?  Normal labor and delivery is divided into the following three stages: Stage 1  This is the longest stage of labor.  This stage can last for hours or days.  Throughout  this stage, you will feel contractions. Contractions generally feel mild, infrequent, and irregular at first. They get stronger, more frequent (about every 2-3 minutes), and more regular as you move through this stage.  This stage ends when your cervix is completely dilated to 4 inches (10 cm) and completely effaced. Stage 2  This stage starts once your cervix is completely effaced and dilated and lasts until the delivery of your baby.  This stage may last from 20 minutes to 2 hours.  This is the stage where you will feel an urge to push your  baby out of your vagina.  You may feel stretching and burning pain, especially when the widest part of your baby's head passes through the vaginal opening (crowning).  Once your baby is delivered, the umbilical cord will be clamped and cut. This usually occurs after waiting a period of 1-2 minutes after delivery.  Your baby will be placed on your bare chest (skin-to-skin contact) in an upright position and covered with a warm blanket. Watch your baby for feeding cues, like rooting or sucking, and help the baby to your breast for his or her first feeding. Stage 3  This stage starts immediately after the birth of your baby and ends after you deliver the placenta.  This stage may take anywhere from 5 to 30 minutes.  After your baby has been delivered, you will feel contractions as your body expels the placenta and your uterus contracts to control bleeding. What can I expect after labor and delivery?  After labor is over, you and your baby will be monitored closely until you are ready to go home to ensure that you are both healthy. Your health care team will teach you how to care for yourself and your baby.  You and your baby will stay in the same room (rooming in) during your hospital stay. This will encourage early bonding and successful breastfeeding.  You may continue to receive fluids and medicines through an IV.  Your uterus will be checked and massaged regularly (fundal massage).  You will have some soreness and pain in your abdomen, vagina, and the area of skin between your vaginal opening and your anus (perineum).  If an incision was made near your vagina (episiotomy) or if you had some vaginal tearing during delivery, cold compresses may be placed on your episiotomy or your tear. This helps to reduce pain and swelling.  You may be given a squirt bottle to use instead of wiping when you go to the bathroom. To use the squirt bottle, follow these steps: ? Before you urinate, fill the  squirt bottle with warm water. Do not use hot water. ? After you urinate, while you are sitting on the toilet, use the squirt bottle to rinse the area around your urethra and vaginal opening. This rinses away any urine and blood. ? Fill the squirt bottle with clean water every time you use the bathroom.  It is normal to have vaginal bleeding after delivery. Wear a sanitary pad for vaginal bleeding and discharge. Summary  Vaginal delivery means that you will give birth by pushing your baby out of your birth canal (vagina).  Your health care provider may monitor your contractions (uterine monitoring) and your baby's heart rate (fetal monitoring).  Your health care provider may perform a physical exam.  Normal labor and delivery is divided into three stages.  After labor is over, you and your baby will be monitored closely until you are ready to go home.  This information is not intended to replace advice given to you by your health care provider. Make sure you discuss any questions you have with your health care provider. Document Revised: 09/22/2017 Document Reviewed: 09/22/2017 Elsevier Patient Education  2020 Elsevier Inc.   Fetal Movement Counts Patient Name: ________________________________________________ Patient Due Date: ____________________ What is a fetal movement count?  A fetal movement count is the number of times that you feel your baby move during a certain amount of time. This may also be called a fetal kick count. A fetal movement count is recommended for every pregnant woman. You may be asked to start counting fetal movements as early as week 28 of your pregnancy. Pay attention to when your baby is most active. You may notice your baby's sleep and wake cycles. You may also notice things that make your baby move more. You should do a fetal movement count:  When your baby is normally most active.  At the same time each day. A good time to count movements is while you are  resting, after having something to eat and drink. How do I count fetal movements? 1. Find a quiet, comfortable area. Sit, or lie down on your side. 2. Write down the date, the start time and stop time, and the number of movements that you felt between those two times. Take this information with you to your health care visits. 3. Write down your start time when you feel the first movement. 4. Count kicks, flutters, swishes, rolls, and jabs. You should feel at least 10 movements. 5. You may stop counting after you have felt 10 movements, or if you have been counting for 2 hours. Write down the stop time. 6. If you do not feel 10 movements in 2 hours, contact your health care provider for further instructions. Your health care provider may want to do additional tests to assess your baby's well-being. Contact a health care provider if:  You feel fewer than 10 movements in 2 hours.  Your baby is not moving like he or she usually does. Date: ____________ Start time: ____________ Stop time: ____________ Movements: ____________ Date: ____________ Start time: ____________ Stop time: ____________ Movements: ____________ Date: ____________ Start time: ____________ Stop time: ____________ Movements: ____________ Date: ____________ Start time: ____________ Stop time: ____________ Movements: ____________ Date: ____________ Start time: ____________ Stop time: ____________ Movements: ____________ Date: ____________ Start time: ____________ Stop time: ____________ Movements: ____________ Date: ____________ Start time: ____________ Stop time: ____________ Movements: ____________ Date: ____________ Start time: ____________ Stop time: ____________ Movements: ____________ Date: ____________ Start time: ____________ Stop time: ____________ Movements: ____________ This information is not intended to replace advice given to you by your health care provider. Make sure you discuss any questions you have with your health  care provider. Document Revised: 04/07/2019 Document Reviewed: 04/07/2019 Elsevier Patient Education  2020 [employer]   Third Trimester of Pregnancy  The third trimester is from week 28 through week 40 (months 7 through 9). This trimester is when your unborn baby (fetus) is growing very fast. At the end of the ninth month, the unborn baby is about 20 inches in length. It weighs about 6-10 pounds. Follow these instructions at home: Medicines  Take over-the-counter and prescription medicines only as told by your doctor. Some medicines are safe and some medicines are not safe during pregnancy.  Take a prenatal vitamin that contains at least 600 micrograms (mcg) of folic acid.  If you have trouble pooping (constipation), take medicine that will make your stool  soft (stool softener) if your doctor approves. Eating and drinking   Eat regular, healthy meals.  Avoid raw meat and uncooked cheese.  If you get low calcium from the food you eat, talk to your doctor about taking a daily calcium supplement.  Eat four or five small meals rather than three large meals a day.  Avoid foods that are high in fat and sugars, such as fried and sweet foods.  To prevent constipation: ? Eat foods that are high in fiber, like fresh fruits and vegetables, whole grains, and beans. ? Drink enough fluids to keep your pee (urine) clear or pale yellow. Activity  Exercise only as told by your doctor. Stop exercising if you start to have cramps.  Avoid heavy lifting, wear low heels, and sit up straight.  Do not exercise if it is too hot, too humid, or if you are in a place of great height (high altitude).  You may continue to have sex unless your doctor tells you not to. Relieving pain and discomfort  Wear a good support bra if your breasts are tender.  Take frequent breaks and rest with your legs raised if you have leg cramps or low back pain.  Take warm water baths (sitz baths) to soothe pain or  discomfort caused by hemorrhoids. Use hemorrhoid cream if your doctor approves.  If you develop puffy, bulging veins (varicose veins) in your legs: ? Wear support hose or compression stockings as told by your doctor. ? Raise (elevate) your feet for 15 minutes, 3-4 times a day. ? Limit salt in your food. Safety  Wear your seat belt when driving.  Make a list of emergency phone numbers, including numbers for family, friends, the hospital, and police and fire departments. Preparing for your baby's arrival To prepare for the arrival of your baby:  Take prenatal classes.  Practice driving to the hospital.  Visit the hospital and tour the maternity area.  Talk to your work about taking leave once the baby comes.  Pack your hospital bag.  Prepare the baby's room.  Go to your doctor visits.  Buy a rear-facing car seat. Learn how to install it in your car. General instructions  Do not use hot tubs, steam rooms, or saunas.  Do not use any products that contain nicotine or tobacco, such as cigarettes and e-cigarettes. If you need help quitting, ask your doctor.  Do not drink alcohol.  Do not douche or use tampons or scented sanitary pads.  Do not cross your legs for long periods of time.  Do not travel for long distances unless you must. Only do so if your doctor says it is okay.  Visit your dentist if you have not gone during your pregnancy. Use a soft toothbrush to brush your teeth. Be gentle when you floss.  Avoid cat litter boxes and soil used by cats. These carry germs that can cause birth defects in the baby and can cause a loss of your baby (miscarriage) or stillbirth.  Keep all your prenatal visits as told by your doctor. This is important. Contact a doctor if:  You are not sure if you are in labor or if your water has broken.  You are dizzy.  You have mild cramps or pressure in your lower belly.  You have a nagging pain in your belly area.  You continue to feel  sick to your stomach, you throw up, or you have watery poop.  You have bad smelling fluid coming from your  vagina.  You have pain when you pee. Get help right away if:  You have a fever.  You are leaking fluid from your vagina.  You are spotting or bleeding from your vagina.  You have severe belly cramps or pain.  You lose or gain weight quickly.  You have trouble catching your breath and have chest pain.  You notice sudden or extreme puffiness (swelling) of your face, hands, ankles, feet, or legs.  You have not felt the baby move in over an hour.  You have severe headaches that do not go away with medicine.  You have trouble seeing.  You are leaking, or you are having a gush of fluid, from your vagina before you are 37 weeks.  You have regular belly spasms (contractions) before you are 37 weeks. Summary  The third trimester is from week 28 through week 40 (months 7 through 9). This time is when your unborn baby is growing very fast.  Follow your doctor's advice about medicine, food, and activity.  Get ready for the arrival of your baby by taking prenatal classes, getting all the baby items ready, preparing the baby's room, and visiting your doctor to be checked.  Get help right away if you are bleeding from your vagina, or you have chest pain and trouble catching your breath, or if you have not felt your baby move in over an hour. This information is not intended to replace advice given to you by your health care provider. Make sure you discuss any questions you have with your health care provider. Document Revised: 12/09/2018 Document Reviewed: 09/23/2016 Elsevier Patient Education  2020 [employer].   [facility]  [address] [place], [place], [place] [address]  Phone: [phone]   [facility] (second location)  [address] [place], [place] [address]  Phone: [phone]   [facility]  [facility]) [address] [place], [place] [address] Phone: [phone]   [facility]  [address], [place], [place] [address]  Phone: [phone]  Breastfeeding  Choosing to breastfeed is one of the best decisions you can make for yourself and your baby. A change in hormones during pregnancy causes your breasts to make breast milk in your milk-producing glands. Hormones prevent breast milk from being released before your baby is born. They also prompt milk flow after birth. Once breastfeeding has begun, thoughts of your baby, as well as his or her sucking or crying, can stimulate the release of milk from your milk-producing glands. Benefits of breastfeeding Research shows that breastfeeding offers many health benefits for infants and mothers. It also offers a cost-free and convenient way to feed your baby. For your baby  Your first milk (colostrum) helps your baby's digestive system to function better.  Special cells in your milk (antibodies) help your baby to fight off infections.  Breastfed babies are less likely to develop asthma, allergies, obesity, or type 2 diabetes. They are also at lower risk for sudden infant death syndrome (SIDS).  Nutrients in breast milk are better able to meet your baby's needs compared to infant formula.  Breast milk improves your baby's brain development. For you  Breastfeeding helps to create a very special bond between you and your baby.  Breastfeeding is convenient. Breast milk costs nothing and is always available at the correct temperature.  Breastfeeding helps to burn calories. It helps you to lose the weight that you gained during pregnancy.  Breastfeeding makes your  uterus return faster to its size before pregnancy. It also slows bleeding (lochia) after you give birth.  Breastfeeding helps to lower your risk of developing type 2 diabetes, osteoporosis, rheumatoid arthritis, cardiovascular disease, and breast, ovarian, uterine,  and endometrial cancer later in life. Breastfeeding basics Starting breastfeeding  Find a comfortable place to sit or lie down, with your neck and back well-supported.  Place a pillow or a rolled-up blanket under your baby to bring him or her to the level of your breast (if you are seated). Nursing pillows are specially designed to help support your arms and your baby while you breastfeed.  Make sure that your baby's tummy (abdomen) is facing your abdomen.  Gently massage your breast. With your fingertips, massage from the outer edges of your breast inward toward the nipple. This encourages milk flow. If your milk flows slowly, you may need to continue this action during the feeding.  Support your breast with 4 fingers underneath and your thumb above your nipple (make the letter "C" with your hand). Make sure your fingers are well away from your nipple and your baby's mouth.  Stroke your baby's lips gently with your finger or nipple.  When your baby's mouth is open wide enough, quickly bring your baby to your breast, placing your entire nipple and as much of the areola as possible into your baby's mouth. The areola is the colored area around your nipple. ? More areola should be visible above your baby's upper lip than below the lower lip. ? Your baby's lips should be opened and extended outward (flanged) to ensure an adequate, comfortable latch. ? Your baby's tongue should be between his or her lower gum and your breast.  Make sure that your baby's mouth is correctly positioned around your nipple (latched). Your baby's lips should create a seal on your breast and be turned out (everted).  It is common for your baby to suck about 2-3 minutes in order to start the flow of breast milk. Latching Teaching your baby how to latch onto your breast properly is very important. An improper latch can cause nipple pain, decreased milk supply, and poor weight gain in your baby. Also, if your baby is not  latched onto your nipple properly, he or she may swallow some air during feeding. This can make your baby fussy. Burping your baby when you switch breasts during the feeding can help to get rid of the air. However, teaching your baby to latch on properly is still the best way to prevent fussiness from swallowing air while breastfeeding. Signs that your baby has successfully latched onto your nipple  Silent tugging or silent sucking, without causing you pain. Infant's lips should be extended outward (flanged).  Swallowing heard between every 3-4 sucks once your milk has started to flow (after your let-down milk reflex occurs).  Muscle movement above and in front of his or her ears while sucking. Signs that your baby has not successfully latched onto your nipple  Sucking sounds or smacking sounds from your baby while breastfeeding.  Nipple pain. If you think your baby has not latched on correctly, slip your finger into the corner of your baby's mouth to break the suction and place it between your baby's gums. Attempt to start breastfeeding again. Signs of successful breastfeeding Signs from your baby  Your baby will gradually decrease the number of sucks or will completely stop sucking.  Your baby will fall asleep.  Your baby's body will relax.  Your baby  will retain a small amount of milk in his or her mouth.  Your baby will let go of your breast by himself or herself. Signs from you  Breasts that have increased in firmness, weight, and size 1-3 hours after feeding.  Breasts that are softer immediately after breastfeeding.  Increased milk volume, as well as a change in milk consistency and color by the fifth day of breastfeeding.  Nipples that are not sore, cracked, or bleeding. Signs that your baby is getting enough milk  Wetting at least 1-2 diapers during the first 24 hours after birth.  Wetting at least 5-6 diapers every 24 hours for the first week after birth. The urine  should be clear or pale yellow by the age of 5 days.  Wetting 6-8 diapers every 24 hours as your baby continues to grow and develop.  At least 3 stools in a 24-hour period by the age of 5 days. The stool should be soft and yellow.  At least 3 stools in a 24-hour period by the age of 7 days. The stool should be seedy and yellow.  No loss of weight greater than 10% of birth weight during the first 3 days of life.  Average weight gain of 4-7 oz (113-198 g) per week after the age of 4 days.  Consistent daily weight gain by the age of 5 days, without weight loss after the age of 2 weeks. After a feeding, your baby may spit up a small amount of milk. This is normal. Breastfeeding frequency and duration Frequent feeding will help you make more milk and can prevent sore nipples and extremely full breasts (breast engorgement). Breastfeed when you feel the need to reduce the fullness of your breasts or when your baby shows signs of hunger. This is called "breastfeeding on demand." Signs that your baby is hungry include:  Increased alertness, activity, or restlessness.  Movement of the head from side to side.  Opening of the mouth when the corner of the mouth or cheek is stroked (rooting).  Increased sucking sounds, smacking lips, cooing, sighing, or squeaking.  Hand-to-mouth movements and sucking on fingers or hands.  Fussing or crying. Avoid introducing a pacifier to your baby in the first 4-6 weeks after your baby is born. After this time, you may choose to use a pacifier. Research has shown that pacifier use during the first year of a baby's life decreases the risk of sudden infant death syndrome (SIDS). Allow your baby to feed on each breast as long as he or she wants. When your baby unlatches or falls asleep while feeding from the first breast, offer the second breast. Because newborns are often sleepy in the first few weeks of life, you may need to awaken your baby to get him or her to  feed. Breastfeeding times will vary from baby to baby. However, the following rules can serve as a guide to help you make sure that your baby is properly fed:  Newborns (babies 41 weeks of age or younger) may breastfeed every 1-3 hours.  Newborns should not go without breastfeeding for longer than 3 hours during the day or 5 hours during the night.  You should breastfeed your baby a minimum of 8 times in a 24-hour period. Breast milk pumping     Pumping and storing breast milk allows you to make sure that your baby is exclusively fed your breast milk, even at times when you are unable to breastfeed. This is especially important if you go  back to work while you are still breastfeeding, or if you are not able to be present during feedings. Your lactation consultant can help you find a method of pumping that works best for you and give you guidelines about how long it is safe to store breast milk. Caring for your breasts while you breastfeed Nipples can become dry, cracked, and sore while breastfeeding. The following recommendations can help keep your breasts moisturized and healthy:  Avoid using soap on your nipples.  Wear a supportive bra designed especially for nursing. Avoid wearing underwire-style bras or extremely tight bras (sports bras).  Air-dry your nipples for 3-4 minutes after each feeding.  Use only cotton bra pads to absorb leaked breast milk. Leaking of breast milk between feedings is normal.  Use lanolin on your nipples after breastfeeding. Lanolin helps to maintain your skin's normal moisture barrier. Pure lanolin is not harmful (not toxic) to your baby. You may also hand express a few drops of breast milk and gently massage that milk into your nipples and allow the milk to air-dry. In the first few weeks after giving birth, some women experience breast engorgement. Engorgement can make your breasts feel heavy, warm, and tender to the touch. Engorgement peaks within 3-5 days  after you give birth. The following recommendations can help to ease engorgement:  Completely empty your breasts while breastfeeding or pumping. You may want to start by applying warm, moist heat (in the shower or with warm, water-soaked hand towels) just before feeding or pumping. This increases circulation and helps the milk flow. If your baby does not completely empty your breasts while breastfeeding, pump any extra milk after he or she is finished.  Apply ice packs to your breasts immediately after breastfeeding or pumping, unless this is too uncomfortable for you. To do this: ? Put ice in a plastic bag. ? Place a towel between your skin and the bag. ? Leave the ice on for 20 minutes, 2-3 times a day.  Make sure that your baby is latched on and positioned properly while breastfeeding. If engorgement persists after 48 hours of following these recommendations, contact your health care provider or a Advertising copywriter. Overall health care recommendations while breastfeeding  Eat 3 healthy meals and 3 snacks every day. Well-nourished mothers who are breastfeeding need an additional 450-500 calories a day. You can meet this requirement by increasing the amount of a balanced diet that you eat.  Drink enough water to keep your urine pale yellow or clear.  Rest often, relax, and continue to take your prenatal vitamins to prevent fatigue, stress, and low vitamin and mineral levels in your body (nutrient deficiencies).  Do not use any products that contain nicotine or tobacco, such as cigarettes and e-cigarettes. Your baby may be harmed by chemicals from cigarettes that pass into breast milk and exposure to secondhand smoke. If you need help quitting, ask your health care provider.  Avoid alcohol.  Do not use illegal drugs or marijuana.  Talk with your health care provider before taking any medicines. These include over-the-counter and prescription medicines as well as vitamins and herbal  supplements. Some medicines that may be harmful to your baby can pass through breast milk.  It is possible to become pregnant while breastfeeding. If birth control is desired, ask your health care provider about options that will be safe while breastfeeding your baby. Where to find more information: [employer]: www.llli.org Contact a health care provider if:  You feel like you  want to stop breastfeeding or have become frustrated with breastfeeding.  Your nipples are cracked or bleeding.  Your breasts are red, tender, or warm.  You have: ? Painful breasts or nipples. ? A swollen area on either breast. ? A fever or chills. ? Nausea or vomiting. ? Drainage other than breast milk from your nipples.  Your breasts do not become full before feedings by the fifth day after you give birth.  You feel sad and depressed.  Your baby is: ? Too sleepy to eat well. ? Having trouble sleeping. ? More than 55 week old and wetting fewer than 6 diapers in a 24-hour period. ? Not gaining weight by 72 days of age.  Your baby has fewer than 3 stools in a 24-hour period.  Your baby's skin or the white parts of his or her eyes become yellow. Get help right away if:  Your baby is overly tired (lethargic) and does not want to wake up and feed.  Your baby develops an unexplained fever. Summary  Breastfeeding offers many health benefits for infant and mothers.  Try to breastfeed your infant when he or she shows early signs of hunger.  Gently tickle or stroke your baby's lips with your finger or nipple to allow the baby to open his or her mouth. Bring the baby to your breast. Make sure that much of the areola is in your baby's mouth. Offer one side and burp the baby before you offer the other side.  Talk with your health care provider or lactation consultant if you have questions or you face problems as you breastfeed. This information is not intended to replace advice given to you  by your health care provider. Make sure you discuss any questions you have with your health care provider. Document Revised: 11/12/2017 Document Reviewed: 09/19/2016 Elsevier Patient Education  2020 [employer].

## 2019-12-23 LAB — CBC
Hematocrit: 32.4 % — ABNORMAL LOW (ref 34.0–46.6)
Hemoglobin: 10.7 g/dL — ABNORMAL LOW (ref 11.1–15.9)
MCH: 28.1 pg (ref 26.6–33.0)
MCHC: 33 g/dL (ref 31.5–35.7)
MCV: 85 fL (ref 79–97)
Platelets: 232 10*3/uL (ref 150–450)
RBC: 3.81 x10E6/uL (ref 3.77–5.28)
RDW: 12.7 % (ref 11.7–15.4)
WBC: 10.5 10*3/uL (ref 3.4–10.8)

## 2019-12-23 LAB — RPR: RPR Ser Ql: NONREACTIVE

## 2019-12-23 LAB — GLUCOSE, 1 HOUR GESTATIONAL: Gestational Diabetes Screen: 82 mg/dL (ref 65–139)

## 2019-12-23 NOTE — Progress Notes (Signed)
ROB-Doing well, reports single episode of epigastric pain yesterday that was relieved with stretching. Blood transfusion consent reviewed and signed. TDaP given, see chart. Plans breastfeeding, epidural, and considering Depo as postpartum contraception. 28 week labs given. Third trimester handouts and baby friendly education provided, see chart. Anticipatory guidance regarding course of prenatal care. Reviewed red flag symptoms and when to call. RTC x 2 weeks for ROB or sooner if needed.    Glorious Peach, [facility], [employer]  Gunnar Bulla, CNM, [facility], [place]

## 2019-12-24 ENCOUNTER — Encounter: Payer: Self-pay | Admitting: Certified Nurse Midwife

## 2019-12-24 DIAGNOSIS — E669 Obesity, unspecified: Secondary | ICD-10-CM | POA: Insufficient documentation

## 2019-12-24 DIAGNOSIS — Z6740 Type O blood, Rh positive: Secondary | ICD-10-CM | POA: Insufficient documentation

## 2019-12-24 DIAGNOSIS — O99019 Anemia complicating pregnancy, unspecified trimester: Secondary | ICD-10-CM | POA: Insufficient documentation

## 2019-12-24 DIAGNOSIS — O99210 Obesity complicating pregnancy, unspecified trimester: Secondary | ICD-10-CM | POA: Insufficient documentation

## 2020-01-01 ENCOUNTER — Observation Stay
Admission: EM | Admit: 2020-01-01 | Discharge: 2020-01-02 | Disposition: A | Discharge status: Home / Self Care | Payer: Medicaid Other | Attending: Certified Nurse Midwife | Admitting: Certified Nurse Midwife

## 2020-01-01 ENCOUNTER — Other Ambulatory Visit: Payer: Self-pay

## 2020-01-01 DIAGNOSIS — Z7982 Long term (current) use of aspirin: Secondary | ICD-10-CM | POA: Insufficient documentation

## 2020-01-01 DIAGNOSIS — O26892 Other specified pregnancy related conditions, second trimester: Principal | ICD-10-CM | POA: Insufficient documentation

## 2020-01-01 DIAGNOSIS — R42 Dizziness and giddiness: Secondary | ICD-10-CM | POA: Insufficient documentation

## 2020-01-01 DIAGNOSIS — Z3A29 29 weeks gestation of pregnancy: Secondary | ICD-10-CM | POA: Insufficient documentation

## 2020-01-01 DIAGNOSIS — R1111 Vomiting without nausea: Secondary | ICD-10-CM | POA: Insufficient documentation

## 2020-01-01 DIAGNOSIS — O99210 Obesity complicating pregnancy, unspecified trimester: Secondary | ICD-10-CM

## 2020-01-01 DIAGNOSIS — Z6740 Type O blood, Rh positive: Secondary | ICD-10-CM

## 2020-01-02 ENCOUNTER — Encounter: Payer: Self-pay | Admitting: Obstetrics and Gynecology

## 2020-01-02 DIAGNOSIS — O26892 Other specified pregnancy related conditions, second trimester: Secondary | ICD-10-CM | POA: Diagnosis not present

## 2020-01-02 DIAGNOSIS — R1111 Vomiting without nausea: Secondary | ICD-10-CM | POA: Diagnosis not present

## 2020-01-02 DIAGNOSIS — R42 Dizziness and giddiness: Secondary | ICD-10-CM | POA: Diagnosis not present

## 2020-01-02 DIAGNOSIS — Z3A29 29 weeks gestation of pregnancy: Secondary | ICD-10-CM

## 2020-01-02 DIAGNOSIS — Z7982 Long term (current) use of aspirin: Secondary | ICD-10-CM | POA: Diagnosis not present

## 2020-01-02 MED ORDER — ACETAMINOPHEN 325 MG PO TABS: 650.0000 mg | ORAL_TABLET | ORAL | Status: DC | PRN

## 2020-01-02 NOTE — OB Triage Note (Signed)
    L&D OB Triage Note  SUBJECTIVE Lisa Hogan is a 24 y.o. G1P0 female at [redacted]w[redacted]d, EDD Estimated Date of Delivery: 03/13/20 who presented to triage with complaints of  Vomiting x 1 , back discomfort, feeling dizzy after BM. She feels good fetal movement, denies contractions , LOF and vaginal bleeding.  OB History  Gravida Para Term Preterm AB Living  1 0 0 0 0 0  SAB TAB Ectopic Multiple Live Births  0 0 0 0 0    # Outcome Date GA Lbr Len/2nd Weight Sex Delivery Anes PTL Lv  1 Current             Medications Prior to Admission  Medication Sig Dispense Refill Last Dose  . acetaminophen (TYLENOL) 325 MG tablet Take 500 mg by mouth every 6 (six) hours as needed.   Past Week at Unknown time  . Prenatal Vit-Fe Fumarate-FA (MULTIVITAMIN-PRENATAL) 27-0.8 MG TABS tablet Take 1 tablet by mouth daily at 12 noon.   01/01/2020 at Unknown time  . albuterol (PROVENTIL HFA;VENTOLIN HFA) 108 (90 BASE) MCG/ACT inhaler Inhale 2 puffs into the lungs every 6 (six) hours as needed for wheezing or shortness of breath.     Marland Kitchen aspirin EC 81 MG tablet Take 1 tablet (81 mg total) by mouth daily. 30 tablet 7   . EPINEPHrine 0.3 mg/0.3 mL IJ SOAJ injection Inject into the muscle.        OBJECTIVE  Nursing Evaluation:   BP 116/66 (BP Location: Left Arm)   Pulse 90   Temp 97.7 F (36.5 C) (Oral)   Resp 16   LMP 06/07/2019 (Exact Date)    Findings:   No contractions      NST was performed and has been reviewed by me.  NST INTERPRETATION: Category I  Mode: External Baseline 145 Moderate variability Accelerations present ( 10 x10 and 15x15) Decelerations absent  Contractions: none    ASSESSMENT Impression:  1.  Pregnancy:  G1P0 at [redacted]w[redacted]d , EDD Estimated Date of Delivery: 03/13/20 2.  Reassuring fetal and maternal status   PLAN 1. Discussed current condition and above findings with patient and reassurance given.  All questions answered. 2. Discharge home with standard labor precautions given to  return to L&D or call the office for problems. 3. Continue routine prenatal care.    Doreene Burke, CNM

## 2020-01-02 NOTE — Discharge Instructions (Signed)
Start taking a stool softener Try not to strain when having a BM May take tylenol as needed Fetal kick counts  LABOR: When contractions begin, you should start to time them from the beginning of one contraction to the beginning of the next.  When contractions are 5-10 minutes apart or less and have been regular for at least an hour, you should call your health care provider.  Notify your doctor if any of the following occur: 1. Bleeding from the vagina 7. Sudden, constant, or occasional abdominal pain  2. Pain or burning when urinating 8. Sudden gushing of fluid from the vagina (with or without continued leaking)  3. Chills or fever 9. Fainting spells, "black outs" or loss of consciousness  4. Increase in vaginal discharge 10. Severe or continued nausea or vomiting  5. Pelvic pressure (sudden increase) 11. Blurring of vision or spots before the eyes  6. Baby moving less than usual 12. Leaking of fluid    FETAL KICK COUNT: Lie on your left side for one hour after a meal, and count the number of times your baby kicks. If it is less than 5 times, get up, move around and drink some juice. Repeat the test 30 minutes later. If it is still less than 5 kicks in an hour, notify your doctor.

## 2020-01-02 NOTE — OB Triage Note (Signed)
Recvd pt from ED. Pt was working [employer] employee) and went to use the restroom, had a BM and then threw up. When she stood up her back started hurting and then started feeling dizzy. She then sat down and just felt weak. No LOF or vaginal bleeding. Feeling baby move well. Rates back pain a 3 out of 10 but states it was worse. Pt states she does still feel weak.

## 2020-01-02 NOTE — Progress Notes (Signed)
Discharge instructions given to pt. Pt verbalizes understanding and has no questions. Pt d/c home with mom.

## 2020-01-07 ENCOUNTER — Ambulatory Visit: Payer: Medicaid Other | Attending: Internal Medicine

## 2020-01-07 DIAGNOSIS — Z23 Encounter for immunization: Secondary | ICD-10-CM

## 2020-01-07 NOTE — Progress Notes (Signed)
   Covid-19 Vaccination Clinic  Name:  Lisa Hogan    MRN: [id number] DOB: 10-Sep-1995  01/07/2020  Lisa Hogan was observed post Covid-19 immunization for 15 minutes without incident. She was provided with Vaccine Information Sheet and instruction to access the V-Safe system.   Lisa Hogan was instructed to call 911 with any severe reactions post vaccine: Marland Kitchen Difficulty breathing  . Swelling of face and throat  . A fast heartbeat  . A bad rash all over body  . Dizziness and weakness   Immunizations Administered    Name Date Dose VIS Date Route   Pfizer COVID-19 Vaccine 01/07/2020 12:01 PM 0.3 mL 10/26/2018 Intramuscular   Manufacturer: [employer], [employer]   Lot: [id number]   NDC: [address]-8

## 2020-01-09 ENCOUNTER — Ambulatory Visit (INDEPENDENT_AMBULATORY_CARE_PROVIDER_SITE_OTHER): Payer: No Typology Code available for payment source | Admitting: Certified Nurse Midwife

## 2020-01-09 ENCOUNTER — Other Ambulatory Visit: Payer: Self-pay

## 2020-01-09 ENCOUNTER — Encounter: Payer: Self-pay | Admitting: Certified Nurse Midwife

## 2020-01-09 VITALS — BP 93/61 | HR 88 | Wt 265.1 lb

## 2020-01-09 DIAGNOSIS — Z3403 Encounter for supervision of normal first pregnancy, third trimester: Secondary | ICD-10-CM

## 2020-01-09 LAB — POCT URINALYSIS DIPSTICK OB
Bilirubin, UA: NEGATIVE
Blood, UA: NEGATIVE
Glucose, UA: NEGATIVE
Ketones, UA: NEGATIVE
Leukocytes, UA: NEGATIVE
Nitrite, UA: NEGATIVE
Spec Grav, UA: 1.01 (ref 1.010–1.025)
Urobilinogen, UA: 0.2 E.U./dL
pH, UA: 7 (ref 5.0–8.0)

## 2020-01-09 NOTE — Progress Notes (Signed)
ROB-Reports round ligament pain. Discussed home treatment measures, handouts provided. Received first dose of COVID vaccine, second scheduled for 01/31/2020. Encouraged to increase dietary protein. Anticipatory guidance regarding course of prenatal care. Reviewed red flag symptoms and when to call. RTC x 2 weeks for ROB or sooner if needed.

## 2020-01-09 NOTE — Patient Instructions (Signed)
Exercise During Pregnancy Exercise is an important part of being healthy for people of all ages. Exercise improves the function of your heart and lungs and helps you maintain strength, flexibility, and a healthy body weight. Exercise also boosts energy levels and elevates mood. Most women should exercise regularly during pregnancy. In rare cases, women with certain medical conditions or complications may be asked to limit or avoid exercise during pregnancy. How does this affect me? Along with maintaining general strength and flexibility, exercising during pregnancy can help:  Keep strength in muscles that are used during labor and childbirth.  Decrease low back pain.  Reduce symptoms of depression.  Control weight gain during pregnancy.  Reduce the risk of needing insulin if you develop diabetes during pregnancy.  Decrease the risk of cesarean delivery.  Speed up your recovery after giving birth. How does this affect my baby? Exercise can help you have a healthy pregnancy. Exercise does not cause premature birth. It will not cause your baby to weigh less at birth. What exercises can I do? Many exercises are safe for you to do during pregnancy. Do a variety of exercises that safely increase your heart and breathing rates and help you build and maintain muscle strength. Do exercises exactly as told by your health care provider. You may do these exercises:  Walking or hiking.  Swimming.  Water aerobics.  Riding a stationary bike.  Strength training.  Modified yoga or Pilates. Tell your instructor that you are pregnant. Avoid overstretching, and avoid lying on your back for long periods of time.  Running or jogging. Only choose this type of exercise if you: ? Ran or jogged regularly before your pregnancy. ? Can run or jog and still talk in complete sentences. What exercises should I avoid? Depending on your level of fitness and whether you exercised regularly before your  pregnancy, you may be told to limit high-intensity exercise. You can tell that you are exercising at a high intensity if you are breathing much harder and faster and cannot hold a conversation while exercising. You must avoid:  Contact sports.  Activities that put you at risk for falling on or being hit in the belly, such as downhill skiing, water skiing, surfing, rock climbing, cycling, gymnastics, and horseback riding.  Scuba diving.  Skydiving.  Yoga or Pilates in a room that is heated to high temperatures.  Jogging or running, unless you ran or jogged regularly before your pregnancy. While jogging or running, you should always be able to talk in full sentences. Do not run or jog so fast that you are unable to have a conversation.  Do not exercise at more than 6,000 feet above sea level (high elevation) if you are not used to exercising at high elevation. How do I exercise in a safe way?   Avoid overheating. Do not exercise in very high temperatures.  Wear loose-fitting, breathable clothes.  Avoid dehydration. Drink enough water before, during, and after exercise to keep your urine pale yellow.  Avoid overstretching. Because of hormone changes during pregnancy, it is easy to overstretch muscles, tendons, and ligaments during pregnancy.  Start slowly and ask your health care provider to recommend the types of exercise that are safe for you.  Do not exercise to lose weight. Follow these instructions at home:  Exercise on most days or all days of the week. Try to exercise for 30 minutes a day, 5 days a week, unless your health care provider tells you not to.  If  you actively exercised before your pregnancy and you are healthy, your health care provider may tell you to continue to do moderate to high-intensity exercise.  If you are just starting to exercise or did not exercise much before your pregnancy, your health care provider may tell you to do low to moderate-intensity  exercise. Questions to ask your health care provider  Is exercise safe for me?  What are signs that I should stop exercising?  Does my health condition mean that I should not exercise during pregnancy?  When should I avoid exercising during pregnancy? Stop exercising and contact a health care provider if: You have any unusual symptoms, such as:  Mild contractions of the uterus or cramps in the abdomen.  Dizziness that does not go away when you rest. Stop exercising and get help right away if: You have any unusual symptoms, such as:  Sudden, severe pain in your low back or your belly.  Mild contractions of the uterus or cramps in the abdomen that do not improve with rest and drinking fluids.  Chest pain.  Bleeding or fluid leaking from your vagina.  Shortness of breath. These symptoms may represent a serious problem that is an emergency. Do not wait to see if the symptoms will go away. Get medical help right away. Call your local emergency services (911 in the [place] Do not drive yourself to the hospital. Summary  Most women should exercise regularly throughout pregnancy. In rare cases, women with certain medical conditions or complications may be asked to limit or avoid exercise during pregnancy.  Do not exercise to lose weight during pregnancy.  Your health care provider will tell you what level of physical activity is right for you.  Stop exercising and contact a health care provider if you have mild contractions of the uterus or cramps in the abdomen. Get help right away if these contractions or cramps do not improve with rest and drinking fluids.  Stop exercising and get help right away if you have sudden, severe pain in your low back or belly, chest pain, shortness of breath, or bleeding or leaking of fluid from your vagina. This information is not intended to replace advice given to you by your health care provider. Make sure you discuss any questions you have with your  health care provider. Document Revised: 12/09/2018 Document Reviewed: 09/22/2018 Elsevier Patient Education  [place].   Common Medications Safe in Pregnancy  Acne:      Constipation:  Benzoyl Peroxide     Colace  Clindamycin      Dulcolax Suppository  Topica Erythromycin     Fibercon  Salicylic Acid      Metamucil         Miralax AVOID:        Senakot   Accutane    Cough:  Retin-A       Cough Drops  Tetracycline      Phenergan w/ Codeine if Rx  Minocycline      Robitussin (Plain & DM)  Antibiotics:     Crabs/Lice:  Ceclor       RID  Cephalosporins    AVOID:  E-Mycins      Kwell  Keflex  Macrobid/Macrodantin   Diarrhea:  Penicillin      Kao-Pectate  Zithromax      Imodium AD         PUSH FLUIDS AVOID:       Cipro     Fever:  Tetracycline  Tylenol (Regular or Extra  Minocycline       Strength)  Levaquin      Extra Strength-Do not          Exceed 8 tabs/24 hrs Caffeine:        <26m/day (equiv. To 1 cup of coffee or  approx. 3 12 oz sodas)         Gas: Cold/Hayfever:       Gas-X  Benadryl      Mylicon  Claritin       Phazyme  **Claritin-D        Chlor-Trimeton    Headaches:  Dimetapp      ASA-Free Excedrin  Drixoral-Non-Drowsy     Cold Compress  Mucinex (Guaifenasin)     Tylenol (Regular or Extra  Sudafed/Sudafed-12 Hour     Strength)  **Sudafed PE Pseudoephedrine   Tylenol Cold & Sinus     Vicks Vapor Rub  Zyrtec  **AVOID if Problems With Blood Pressure         Heartburn: Avoid lying down for at least 1 hour after meals  Aciphex      Maalox     Rash:  Milk of Magnesia     Benadryl    Mylanta       1% Hydrocortisone Cream  Pepcid  Pepcid Complete   Sleep Aids:  Prevacid      Ambien   Prilosec       Benadryl  Rolaids       Chamomile Tea  Tums (Limit 4/day)     Unisom  Zantac       Tylenol PM         Warm milk-add vanilla or  Hemorrhoids:       Sugar for taste  Anusol/Anusol H.C.  (RX: Analapram 2.5%)  Sugar  Substitutes:  Hydrocortisone OTC     Ok in moderation  Preparation H      Tucks        Vaseline lotion applied to tissue with wiping    Herpes:     Throat:  Acyclovir      Oragel  Famvir  Valtrex     Vaccines:         Flu Shot Leg Cramps:       *Gardasil  Benadryl      Hepatitis A         Hepatitis B Nasal Spray:       Pneumovax  Saline Nasal Spray     Polio Booster         Tetanus Nausea:       Tuberculosis test or PPD  Vitamin B6 25 mg TID   AVOID:    Dramamine      *Gardasil  Emetrol       Live Poliovirus  Ginger Root 250 mg QID    MMR (measles, mumps &  High Complex Carbs @ Bedtime    rebella)  Sea Bands-Accupressure    Varicella (Chickenpox)  Unisom 1/2 tab TID     *No known complications           If received before Pain:         Known pregnancy;   Darvocet       Resume series after  Lortab        Delivery  Percocet    Yeast:   Tramadol      Femstat  Tylenol 3      Gyne-lotrimin  Ultram       Monistat  Vicodin           MISC:         All Sunscreens           Hair Coloring/highlights          Insect Repellant's          (Including DEET)         Mystic Tans    Back Pain in Pregnancy Back pain during pregnancy is common. Back pain may be caused by several factors that are related to changes during your pregnancy. Follow these instructions at home: Managing pain, stiffness, and swelling      If directed, for sudden (acute) back pain, put ice on the painful area. ? Put ice in a plastic bag. ? Place a towel between your skin and the bag. ? Leave the ice on for 20 minutes, 2-3 times per day.  If directed, apply heat to the affected area before you exercise. Use the heat source that your health care provider recommends, such as a moist heat pack or a heating pad. ? Place a towel between your skin and the heat source. ? Leave the heat on for 20-30 minutes. ? Remove the heat if your skin turns bright red. This is especially important if you are unable to feel  pain, heat, or cold. You may have a greater risk of getting burned.  If directed, massage the affected area. Activity  Exercise as told by your health care provider. Gentle exercise is the best way to prevent or manage back pain.  Listen to your body when lifting. If lifting hurts, ask for help or bend your knees. This uses your leg muscles instead of your back muscles.  Squat down when picking up something from the floor. Do not bend over.  Only use bed rest for short periods as told by your health care provider. Bed rest should only be used for the most severe episodes of back pain. Standing, sitting, and lying down  Do not stand in one place for long periods of time.  Use good posture when sitting. Make sure your head rests over your shoulders and is not hanging forward. Use a pillow on your lower back if necessary.  Try sleeping on your side, preferably the left side, with a pregnancy support pillow or 1-2 regular pillows between your legs. ? If you have back pain after a night's rest, your bed may be too soft. ? A firm mattress may provide more support for your back during pregnancy. General instructions  Do not wear high heels.  Eat a healthy diet. Try to gain weight within your health care provider's recommendations.  Use a maternity girdle, elastic sling, or back brace as told by your health care provider.  Take over-the-counter and prescription medicines only as told by your health care provider.  Work with a physical therapist or massage therapist to find ways to manage back pain. Acupuncture or massage therapy may be helpful.  Keep all follow-up visits as told by your health care provider. This is important. Contact a health care provider if:  Your back pain interferes with your daily activities.  You have increasing pain in other parts of your body. Get help right away if:  You develop numbness, tingling, weakness, or problems with the use of your arms or  legs.  You develop severe back pain that is not controlled with medicine.  You have a change in bowel or bladder control.  You develop shortness of breath,  dizziness, or you faint.  You develop nausea, vomiting, or sweating.  You have back pain that is a rhythmic, cramping pain similar to labor pains. Labor pain is usually 1-2 minutes apart, lasts for about 1 minute, and involves a bearing down feeling or pressure in your pelvis.  You have back pain and your water breaks or you have vaginal bleeding.  You have back pain or numbness that travels down your leg.  Your back pain developed after you fell.  You develop pain on one side of your back.  You see blood in your urine.  You develop skin blisters in the area of your back pain. Summary  Back pain may be caused by several factors that are related to changes during your pregnancy.  Follow instructions as told by your health care provider for managing pain, stiffness, and swelling.  Exercise as told by your health care provider. Gentle exercise is the best way to prevent or manage back pain.  Take over-the-counter and prescription medicines only as told by your health care provider.  Keep all follow-up visits as told by your health care provider. This is important. This information is not intended to replace advice given to you by your health care provider. Make sure you discuss any questions you have with your health care provider. Document Revised: 12/07/2018 Document Reviewed: 02/03/2018 Elsevier Patient Education  [place].    Round Ligament Pain  The round ligament is a cord of muscle and tissue that helps support the uterus. It can become a source of pain during pregnancy if it becomes stretched or twisted as the baby grows. The pain usually begins in the second trimester (13-28 weeks) of pregnancy, and it can come and go until the baby is delivered. It is not a serious problem, and it does not cause harm to  the baby. Round ligament pain is usually a short, sharp, and pinching pain, but it can also be a dull, lingering, and aching pain. The pain is felt in the lower side of the abdomen or in the groin. It usually starts deep in the groin and moves up to the outside of the hip area. The pain may occur when you:  Suddenly change position, such as quickly going from a sitting to standing position.  Roll over in bed.  Cough or sneeze.  Do physical activity. Follow these instructions at home:   Watch your condition for any changes.  When the pain starts, relax. Then try any of these methods to help with the pain: ? Sitting down. ? Flexing your knees up to your abdomen. ? Lying on your side with one pillow under your abdomen and another pillow between your legs. ? Sitting in a warm bath for 15-20 minutes or until the pain goes away.  Take over-the-counter and prescription medicines only as told by your health care provider.  Move slowly when you sit down or stand up.  Avoid long walks if they cause pain.  Stop or reduce your physical activities if they cause pain.  Keep all follow-up visits as told by your health care provider. This is important. Contact a health care provider if:  Your pain does not go away with treatment.  You feel pain in your back that you did not have before.  Your medicine is not helping. Get help right away if:  You have a fever or chills.  You develop uterine contractions.  You have vaginal bleeding.  You have nausea or vomiting.  You  have diarrhea.  You have pain when you urinate. Summary  Round ligament pain is felt in the lower abdomen or groin. It is usually a short, sharp, and pinching pain. It can also be a dull, lingering, and aching pain.  This pain usually begins in the second trimester (13-28 weeks). It occurs because the uterus is stretching with the growing baby, and it is not harmful to the baby.  You may notice the pain when you  suddenly change position, when you cough or sneeze, or during physical activity.  Relaxing, flexing your knees to your abdomen, lying on one side, or taking a warm bath may help to get rid of the pain.  Get help from your health care provider if the pain does not go away or if you have vaginal bleeding, nausea, vomiting, diarrhea, or painful urination. This information is not intended to replace advice given to you by your health care provider. Make sure you discuss any questions you have with your health care provider. Document Revised: 02/03/2018 Document Reviewed: 02/03/2018 Elsevier Patient Education  [place].

## 2020-01-09 NOTE — Progress Notes (Signed)
Patient comes in today for ROB visit. She is having some round ligament pain.

## 2020-01-23 ENCOUNTER — Ambulatory Visit (INDEPENDENT_AMBULATORY_CARE_PROVIDER_SITE_OTHER): Payer: No Typology Code available for payment source | Admitting: Certified Nurse Midwife

## 2020-01-23 ENCOUNTER — Other Ambulatory Visit: Payer: Self-pay

## 2020-01-23 ENCOUNTER — Encounter: Payer: Self-pay | Admitting: Certified Nurse Midwife

## 2020-01-23 VITALS — BP 97/62 | HR 83 | Wt 266.6 lb

## 2020-01-23 DIAGNOSIS — Z3A32 32 weeks gestation of pregnancy: Secondary | ICD-10-CM

## 2020-01-23 LAB — POCT URINALYSIS DIPSTICK OB
Bilirubin, UA: NEGATIVE
Blood, UA: NEGATIVE
Glucose, UA: NEGATIVE
Ketones, UA: NEGATIVE
Leukocytes, UA: NEGATIVE
Nitrite, UA: NEGATIVE
POC,PROTEIN,UA: NEGATIVE
Spec Grav, UA: 1.015 (ref 1.010–1.025)
Urobilinogen, UA: 0.2 E.U./dL
pH, UA: 5 (ref 5.0–8.0)

## 2020-01-23 NOTE — Progress Notes (Signed)
ROB doing well. Feels good movement. State she had a nose bleed x 1 , reassurance given. Discussed self help measures. Reviewed peds list. She plans on using [facility]. Follow up ROB 2 wk.   Doreene Burke, CNM

## 2020-01-23 NOTE — Patient Instructions (Signed)
[facility]  [facility]  [address], [place], [place] [address]  Phone: [phone]  [facility] (second location)  [address] [place], [place] [address]  Phone: [phone]  [facility] ([place]) [address], [place], [place] [address] Phone: [phone]  [facility]  [address] [place], [place] [address]  Phone: [phone] 

## 2020-01-31 ENCOUNTER — Ambulatory Visit: Payer: Medicaid Other | Attending: Internal Medicine

## 2020-01-31 DIAGNOSIS — Z23 Encounter for immunization: Secondary | ICD-10-CM

## 2020-01-31 NOTE — Progress Notes (Signed)
   Covid-19 Vaccination Clinic  Name:  Catalina Salasar    MRN: [id number] DOB: 04-13-1996  01/31/2020  Ms. Veldhuizen was observed post Covid-19 immunization for 15 minutes without incident. She was provided with Vaccine Information Sheet and instruction to access the V-Safe system.   Ms. Messenger was instructed to call 911 with any severe reactions post vaccine: Marland Kitchen Difficulty breathing  . Swelling of face and throat  . A fast heartbeat  . A bad rash all over body  . Dizziness and weakness   Immunizations Administered    Name Date Dose VIS Date Route   Pfizer COVID-19 Vaccine 01/31/2020  8:55 AM 0.3 mL 10/26/2018 Intramuscular   Manufacturer: [employer], [employer]   Lot: [id number]   NDC: [address]-8

## 2020-02-08 ENCOUNTER — Ambulatory Visit (INDEPENDENT_AMBULATORY_CARE_PROVIDER_SITE_OTHER): Payer: No Typology Code available for payment source | Admitting: Certified Nurse Midwife

## 2020-02-08 ENCOUNTER — Telehealth: Payer: Self-pay

## 2020-02-08 ENCOUNTER — Other Ambulatory Visit: Payer: Self-pay

## 2020-02-08 VITALS — BP 95/71 | HR 97 | Wt 269.2 lb

## 2020-02-08 DIAGNOSIS — Z3A35 35 weeks gestation of pregnancy: Secondary | ICD-10-CM

## 2020-02-08 LAB — POCT URINALYSIS DIPSTICK OB
Bilirubin, UA: NEGATIVE
Blood, UA: NEGATIVE
Glucose, UA: NEGATIVE
Leukocytes, UA: NEGATIVE
Nitrite, UA: POSITIVE
POC,PROTEIN,UA: NEGATIVE
Spec Grav, UA: 1.025 (ref 1.010–1.025)
Urobilinogen, UA: 0.2 E.U./dL
pH, UA: 5 (ref 5.0–8.0)

## 2020-02-08 MED ORDER — NITROFURANTOIN MONOHYD MACRO 100 MG PO CAPS
100.0000 mg | ORAL_CAPSULE | Freq: Two times a day (BID) | ORAL | 0 refills | Status: AC
Start: 2020-02-08 — End: 2020-02-15

## 2020-02-08 NOTE — Patient Instructions (Signed)
Group B Streptococcus Infection During Pregnancy °Group B Streptococcus (GBS) is a type of bacteria that is often found in healthy people. It is commonly found in the rectum, vagina, and intestines. In people who are healthy and not pregnant, the bacteria rarely cause serious illness or complications. However, women who test positive for GBS during pregnancy can pass the bacteria to the baby during childbirth. This can cause serious infection in the baby after birth. °Women with GBS may also have infections during their pregnancy or soon after childbirth. The infections include urinary tract infections (UTIs) or infections of the uterus. GBS also increases a woman's risk of complications during pregnancy, such as early labor or delivery, miscarriage, or stillbirth. Routine testing for GBS is recommended for all pregnant women. °What are the causes? °This condition is caused by bacteria called Streptococcus agalactiae. °What increases the risk? °You may have a higher risk for GBS infection during pregnancy if you had one during a past pregnancy. °What are the signs or symptoms? °In most cases, GBS infection does not cause symptoms in pregnant women. If symptoms exist, they may include: °· Labor that starts before the 37th week of pregnancy. °· A UTI or bladder infection. This may cause a fever, frequent urination, or pain and burning during urination. °· Fever during labor. There can also be a rapid heartbeat in the mother or baby. °Rare but serious symptoms of a GBS infection in women include: °· Blood infection (septicemia). This may cause fever, chills, or confusion. °· Lung infection (pneumonia). This may cause fever, chills, cough, rapid breathing, chest pain, or difficulty breathing. °· Bone, joint, skin, or soft tissue infection. °How is this diagnosed? °You may be screened for GBS between week 35 and week 37 of pregnancy. If you have symptoms of preterm labor, you may be screened earlier. This condition is  diagnosed based on lab test results from: °· A swab of fluid from the vagina and rectum. °· A urine sample. °How is this treated? °This condition is treated with antibiotic medicine. Antibiotic medicine may be given: °· To you when you go into labor, or as soon as your water breaks. The medicines will continue until after you give birth. If you are having a cesarean delivery, you do not need antibiotics unless your water has broken. °· To your baby, if he or she requires treatment. Your health care provider will check your baby to decide if he or she needs antibiotics to prevent a serious infection. °Follow these instructions at home: °· Take over-the-counter and prescription medicines only as told by your health care provider. °· Take your antibiotic medicine as told by your health care provider. Do not stop taking the antibiotic even if you start to feel better. °· Keep all pre-birth (prenatal) visits and follow-up visits as told by your health care provider. This is important. °Contact a health care provider if: °· You have pain or burning when you urinate. °· You have to urinate more often than usual. °· You have a fever or chills. °· You develop a bad-smelling vaginal discharge. °Get help right away if: °· Your water breaks. °· You go into labor. °· You have severe pain in your abdomen. °· You have difficulty breathing. °· You have chest pain. °These symptoms may represent a serious problem that is an emergency. Do not wait to see if the symptoms will go away. Get medical help right away. Call your local emergency services (911 in the [place] Do not drive yourself to   the hospital. °Summary °· GBS is a type of bacteria that is common in healthy people. °· During pregnancy, colonization with GBS can cause serious complications for you or your baby. °· Your health care provider will screen you between 35 and 37 weeks of pregnancy to determine if you are colonized with GBS. °· If you are colonized with GBS during  pregnancy, your health care provider will recommend antibiotics through an IV during labor. °· After delivery, your baby will be evaluated for complications related to potential GBS infection and may require antibiotics to prevent a serious infection. °This information is not intended to replace advice given to you by your health care provider. Make sure you discuss any questions you have with your health care provider. °Document Revised: 03/14/2019 Document Reviewed: 03/14/2019 °Elsevier Patient Education © 2020 Elsevier Inc. ° °

## 2020-02-08 NOTE — Telephone Encounter (Signed)
mychart message sent to patient

## 2020-02-08 NOTE — Addendum Note (Signed)
Addended by: Mechele Claude on: 02/08/2020 12:07 PM   Modules accepted: Orders

## 2020-02-08 NOTE — Progress Notes (Addendum)
Body mass index is 39.75 kg/m. ROB dong well. Feels good movement. Discussed RSB , see pregnancy check list for topics discussed. Reviewed birth plans epidural. Discussed induction due to BMI. U/s at 37 wks for growth due to BMI. She verbalizes and agrees to plan .Urine dip today posotive for UTI, macrobid ordered. Needs TOC 4 wks. Herbal prep handout given.  Follow up 1 wk.   Doreene Burke, CNM

## 2020-02-10 ENCOUNTER — Observation Stay
Admission: EM | Admit: 2020-02-10 | Discharge: 2020-02-10 | Disposition: A | Discharge status: Home / Self Care | Payer: Medicaid Other | Attending: Certified Nurse Midwife | Admitting: Certified Nurse Midwife

## 2020-02-10 ENCOUNTER — Other Ambulatory Visit: Payer: Self-pay

## 2020-02-10 ENCOUNTER — Telehealth: Payer: Self-pay | Admitting: Certified Nurse Midwife

## 2020-02-10 ENCOUNTER — Encounter: Payer: Self-pay | Admitting: Obstetrics and Gynecology

## 2020-02-10 DIAGNOSIS — Z3A35 35 weeks gestation of pregnancy: Secondary | ICD-10-CM | POA: Insufficient documentation

## 2020-02-10 DIAGNOSIS — O99019 Anemia complicating pregnancy, unspecified trimester: Secondary | ICD-10-CM

## 2020-02-10 DIAGNOSIS — Z0371 Encounter for suspected problem with amniotic cavity and membrane ruled out: Secondary | ICD-10-CM | POA: Diagnosis not present

## 2020-02-10 DIAGNOSIS — Z6740 Type O blood, Rh positive: Secondary | ICD-10-CM

## 2020-02-10 DIAGNOSIS — O99210 Obesity complicating pregnancy, unspecified trimester: Secondary | ICD-10-CM

## 2020-02-10 LAB — RUPTURE OF MEMBRANE (ROM)PLUS: Rom Plus: NEGATIVE

## 2020-02-10 NOTE — Discharge Instructions (Signed)
LABOR: When contractions begin, you should start to time them from the beginning of one contraction to the beginning of the next.  When contractions are 5-10 minutes apart or less and have been regular for at least an hour, you should call your health care provider.  Notify your doctor if any of the following occur: 1. Bleeding from the vagina 7. Sudden, constant, or occasional abdominal pain  2. Pain or burning when urinating 8. Sudden gushing of fluid from the vagina (with or without continued leaking)  3. Chills or fever 9. Fainting spells, "black outs" or loss of consciousness  4. Increase in vaginal discharge 10. Severe or continued nausea or vomiting  5. Pelvic pressure (sudden increase) 11. Blurring of vision or spots before the eyes  6. Baby moving less than usual 12. Leaking of fluid    FETAL KICK COUNT: Lie on your left side for one hour after a meal, and count the number of times your baby kicks. If it is less than 5 times, get up, move around and drink some juice. Repeat the test 30 minutes later. If it is still less than 5 kicks in an hour, notify your doctor.  Get plenty of rest and stay well hydrated!

## 2020-02-10 NOTE — Progress Notes (Signed)
Spoke with Doreene Burke CNM. Okay to discontinue EFM due to reactive strip.

## 2020-02-10 NOTE — OB Triage Note (Signed)
Pt presented to [facility] with complaints of leaking of fluid. Pt stated she was a [employer] about 15 minutes and felt a gush of fluid. She checked and it had no odor and was clear, small amount. Pt stated that she has had intercourse right before she went to [place]. Pt states that she has been feeling the baby move. Pt denies vag bleeding, other than a small amount of spotting after intercourse. Pt states the spotting is normal for her after intercourse. Monitors applied and assesing.

## 2020-02-10 NOTE — Telephone Encounter (Signed)
Called patient to inform her we received her FMLA paperwork. Was unsuccessful reaching patient however LVM for her to return our phone call.

## 2020-02-10 NOTE — Progress Notes (Signed)
RN provided discharge instructions. Pt verbalized understanding and no questions at this time. Pt discharged home in stable condition.

## 2020-02-10 NOTE — OB Triage Note (Signed)
    L&D OB Triage Note  SUBJECTIVE Janequa Kipnis is a 24 y.o. G1P0 female at [redacted]w[redacted]d, EDD Estimated Date of Delivery: 03/13/20 who presented to triage with complaints of leaking fluid x 1 while shopping in [place] pt state she experienced x gush of clear fluid. She admits to intercourse early this evening. She denies contractions and feels good fetal movement.   OB History  Gravida Para Term Preterm AB Living  1 0 0 0 0 0  SAB TAB Ectopic Multiple Live Births  0 0 0 0 0    # Outcome Date GA Lbr Len/2nd Weight Sex Delivery Anes PTL Lv  1 Current             Medications Prior to Admission  Medication Sig Dispense Refill Last Dose  . acetaminophen (TYLENOL) 325 MG tablet Take 500 mg by mouth every 6 (six) hours as needed.   Past Week at Unknown time  . albuterol (PROVENTIL HFA;VENTOLIN HFA) 108 (90 BASE) MCG/ACT inhaler Inhale 2 puffs into the lungs every 6 (six) hours as needed for wheezing or shortness of breath.   Past Month at Unknown time  . nitrofurantoin, macrocrystal-monohydrate, (MACROBID) 100 MG capsule Take 1 capsule (100 mg total) by mouth 2 (two) times daily for 7 days. 14 capsule 0 02/10/2020 at Unknown time  . Prenatal Vit-Fe Fumarate-FA (MULTIVITAMIN-PRENATAL) 27-0.8 MG TABS tablet Take 1 tablet by mouth daily at 12 noon.   Past Month at Unknown time  . aspirin EC 81 MG tablet Take 1 tablet (81 mg total) by mouth daily. (Patient not taking: Reported on 02/10/2020) 30 tablet 7 Not Taking at Unknown time  . EPINEPHrine 0.3 mg/0.3 mL IJ SOAJ injection Inject into the muscle.        OBJECTIVE  Nursing Evaluation:   BP 118/69 (BP Location: Right Arm)   Pulse (!) 133   Temp 98.4 F (36.9 C) (Oral)   Resp 18   Ht 5\' 9"  (1.753 m)   Wt 121.6 kg   LMP 06/07/2019 (Exact Date)   BMI 39.58 kg/m    Findings:   ROM plus negative       NST was performed and has been reviewed by me.  NST INTERPRETATION: Category I  Mode: External Baseline Rate (A): 145 bpm Variability:  Moderate Accelerations: 15 x 15 Decelerations: None     Contraction Frequency (min): occassional (x2)  ASSESSMENT Impression:  1.  Pregnancy:  G1P0 at [redacted]w[redacted]d , EDD Estimated Date of Delivery: 03/13/20 2.  Reassuring fetal and maternal status 3.  ROM plus negative, intact membranes.   PLAN 1. Discussed current condition and above findings with patient and reassurance given.  All questions answered. 2. Discharge home with standard labor precautions given to return to L&D or call the office for problems. 3. Continue routine prenatal care.    03/15/20, CNM

## 2020-02-12 LAB — URINE CULTURE

## 2020-02-15 ENCOUNTER — Ambulatory Visit (INDEPENDENT_AMBULATORY_CARE_PROVIDER_SITE_OTHER): Payer: No Typology Code available for payment source | Admitting: Certified Nurse Midwife

## 2020-02-15 ENCOUNTER — Other Ambulatory Visit: Payer: Self-pay

## 2020-02-15 ENCOUNTER — Encounter: Payer: Self-pay | Admitting: Certified Nurse Midwife

## 2020-02-15 VITALS — BP 99/66 | HR 80 | Wt 266.4 lb

## 2020-02-15 DIAGNOSIS — Z3A36 36 weeks gestation of pregnancy: Secondary | ICD-10-CM

## 2020-02-15 LAB — POCT URINALYSIS DIPSTICK OB
Bilirubin, UA: NEGATIVE
Blood, UA: NEGATIVE
Glucose, UA: NEGATIVE
Ketones, UA: NEGATIVE
Leukocytes, UA: NEGATIVE
Nitrite, UA: NEGATIVE
POC,PROTEIN,UA: NEGATIVE
Spec Grav, UA: 1.01 (ref 1.010–1.025)
Urobilinogen, UA: 0.2 E.U./dL
pH, UA: 5 (ref 5.0–8.0)

## 2020-02-15 MED ORDER — FLUCONAZOLE 150 MG PO TABS
150.0000 mg | ORAL_TABLET | Freq: Once | ORAL | 0 refills | Status: AC
Start: 2020-02-15 — End: 2020-02-15

## 2020-02-15 NOTE — Progress Notes (Signed)
ROB doing well. Feels good movement. GBS and cultures today. SVE 2/50/-2 . Labor precations reviewed. Pt complains of vaginal itching and increased discharge. Diflucan ordered. Follow up 1 wk for ROB.   Doreene Burke, CNM

## 2020-02-15 NOTE — Progress Notes (Signed)
Body mass index is 39.35 kg/m.

## 2020-02-15 NOTE — Patient Instructions (Signed)
Braxton Hicks Contractions °Contractions of the uterus can occur throughout pregnancy, but they are not always a sign that you are in labor. You may have practice contractions called Braxton Hicks contractions. These false labor contractions are sometimes confused with true labor. °What are Braxton Hicks contractions? °Braxton Hicks contractions are tightening movements that occur in the muscles of the uterus before labor. Unlike true labor contractions, these contractions do not result in opening (dilation) and thinning of the cervix. Toward the end of pregnancy (32-34 weeks), Braxton Hicks contractions can happen more often and may become stronger. These contractions are sometimes difficult to tell apart from true labor because they can be very uncomfortable. You should not feel embarrassed if you go to the hospital with false labor. °Sometimes, the only way to tell if you are in true labor is for your health care provider to look for changes in the cervix. The health care provider will do a physical exam and may monitor your contractions. If you are not in true labor, the exam should show that your cervix is not dilating and your water has not broken. °If there are no other health problems associated with your pregnancy, it is completely safe for you to be sent home with false labor. You may continue to have Braxton Hicks contractions until you go into true labor. °How to tell the difference between true labor and false labor °True labor °· Contractions last 30-70 seconds. °· Contractions become very regular. °· Discomfort is usually felt in the top of the uterus, and it spreads to the lower abdomen and low back. °· Contractions do not go away with walking. °· Contractions usually become more intense and increase in frequency. °· The cervix dilates and gets thinner. °False labor °· Contractions are usually shorter and not as strong as true labor contractions. °· Contractions are usually irregular. °· Contractions  are often felt in the front of the lower abdomen and in the groin. °· Contractions may go away when you walk around or change positions while lying down. °· Contractions get weaker and are shorter-lasting as time goes on. °· The cervix usually does not dilate or become thin. °Follow these instructions at home: ° °· Take over-the-counter and prescription medicines only as told by your health care provider. °· Keep up with your usual exercises and follow other instructions from your health care provider. °· Eat and drink lightly if you think you are going into labor. °· If Braxton Hicks contractions are making you uncomfortable: °? Change your position from lying down or resting to walking, or change from walking to resting. °? Sit and rest in a tub of warm water. °? Drink enough fluid to keep your urine pale yellow. Dehydration may cause these contractions. °? Do slow and deep breathing several times an hour. °· Keep all follow-up prenatal visits as told by your health care provider. This is important. °Contact a health care provider if: °· You have a fever. °· You have continuous pain in your abdomen. °Get help right away if: °· Your contractions become stronger, more regular, and closer together. °· You have fluid leaking or gushing from your vagina. °· You pass blood-tinged mucus (bloody show). °· You have bleeding from your vagina. °· You have low back pain that you never had before. °· You feel your baby’s head pushing down and causing pelvic pressure. °· Your baby is not moving inside you as much as it used to. °Summary °· Contractions that occur before labor are   called Braxton Hicks contractions, false labor, or practice contractions. °· Braxton Hicks contractions are usually shorter, weaker, farther apart, and less regular than true labor contractions. True labor contractions usually become progressively stronger and regular, and they become more frequent. °· Manage discomfort from Braxton Hicks contractions  by changing position, resting in a warm bath, drinking plenty of water, or practicing deep breathing. °This information is not intended to replace advice given to you by your health care provider. Make sure you discuss any questions you have with your health care provider. °Document Revised: 07/31/2017 Document Reviewed: 01/01/2017 °Elsevier Patient Education © 2020 [employer] ° °

## 2020-02-17 ENCOUNTER — Telehealth: Payer: Self-pay | Admitting: Certified Nurse Midwife

## 2020-02-17 DIAGNOSIS — Z3403 Encounter for supervision of normal first pregnancy, third trimester: Secondary | ICD-10-CM

## 2020-02-17 DIAGNOSIS — A749 Chlamydial infection, unspecified: Secondary | ICD-10-CM

## 2020-02-17 DIAGNOSIS — O98813 Other maternal infectious and parasitic diseases complicating pregnancy, third trimester: Secondary | ICD-10-CM

## 2020-02-17 DIAGNOSIS — Z3A36 36 weeks gestation of pregnancy: Secondary | ICD-10-CM

## 2020-02-17 LAB — STREP GP B NAA: Strep Gp B NAA: NEGATIVE

## 2020-02-17 LAB — GC/CHLAMYDIA PROBE AMP
Chlamydia trachomatis, NAA: POSITIVE — AB
Neisseria Gonorrhoeae by PCR: NEGATIVE

## 2020-02-17 MED ORDER — AZITHROMYCIN 500 MG PO TABS
1000.0000 mg | ORAL_TABLET | Freq: Once | ORAL | 1 refills | Status: AC
Start: 2020-02-17 — End: 2020-02-17

## 2020-02-17 NOTE — Telephone Encounter (Signed)
PTs mom called in and stated that she is worried about the test results she got on mychart. I advised her that her provider is out of the office till Monday and that the pt gets the results 1st. Pattricia Boss isn't in the office today but when she comes back Monday she will see the results. The mom stated she is just going to worry all weekend. Can you please look at the results and contact the pt. Please advise

## 2020-02-17 NOTE — Telephone Encounter (Signed)
Spoke with patient regarding positive culture. Serafina Royals CNM sent azithromycin to preferred pharmacy. All questions answered.

## 2020-02-17 NOTE — Telephone Encounter (Signed)
Rx Azithromycin, see orders.    Gunnar Bulla, CNM [facility], [facility] 02/17/20 5:05 PM

## 2020-02-17 NOTE — Telephone Encounter (Signed)
Pt called in and stated that she read your message from [place] and that she was wanting to know  Would some meds be sent in for that. The pt is requesting a message and call back. Please advise

## 2020-02-19 ENCOUNTER — Other Ambulatory Visit: Payer: Self-pay | Admitting: Certified Nurse Midwife

## 2020-02-19 MED ORDER — AZITHROMYCIN 500 MG PO TABS: 1000.0000 mg | ORAL_TABLET | Freq: Once | ORAL | 1 refills | Status: AC

## 2020-02-19 NOTE — Progress Notes (Signed)
Vaginal swab positive for Chlamydia. Orders placed for treatment. Pt  Notified via my chart.   Doreene Burke, CNM

## 2020-02-22 ENCOUNTER — Encounter: Payer: Self-pay | Admitting: Certified Nurse Midwife

## 2020-02-22 ENCOUNTER — Other Ambulatory Visit: Payer: Self-pay

## 2020-02-22 ENCOUNTER — Ambulatory Visit (INDEPENDENT_AMBULATORY_CARE_PROVIDER_SITE_OTHER): Payer: No Typology Code available for payment source | Admitting: Certified Nurse Midwife

## 2020-02-22 VITALS — BP 112/76 | HR 93 | Wt 266.4 lb

## 2020-02-22 DIAGNOSIS — Z3A37 37 weeks gestation of pregnancy: Secondary | ICD-10-CM

## 2020-02-22 LAB — POCT URINALYSIS DIPSTICK OB
Bilirubin, UA: NEGATIVE
Blood, UA: NEGATIVE
Glucose, UA: NEGATIVE
Ketones, UA: NEGATIVE
Leukocytes, UA: NEGATIVE
Nitrite, UA: NEGATIVE
POC,PROTEIN,UA: NEGATIVE
Spec Grav, UA: >=1.03 — AB (ref 1.010–1.025)
Urobilinogen, UA: 0.2 E.U./dL
pH, UA: 5 (ref 5.0–8.0)

## 2020-02-22 NOTE — Progress Notes (Signed)
Body mass index is 39.34 kg/m. ROB   Doing well. Feels good movement.  Has taken treatment for chlamydia, will do TOC next visit. Discussed labor precautions. Verbalizes understanding.   Doreene Burke, CNM

## 2020-02-22 NOTE — Patient Instructions (Signed)
Braxton Hicks Contractions °Contractions of the uterus can occur throughout pregnancy, but they are not always a sign that you are in labor. You may have practice contractions called Braxton Hicks contractions. These false labor contractions are sometimes confused with true labor. °What are Braxton Hicks contractions? °Braxton Hicks contractions are tightening movements that occur in the muscles of the uterus before labor. Unlike true labor contractions, these contractions do not result in opening (dilation) and thinning of the cervix. Toward the end of pregnancy (32-34 weeks), Braxton Hicks contractions can happen more often and may become stronger. These contractions are sometimes difficult to tell apart from true labor because they can be very uncomfortable. You should not feel embarrassed if you go to the hospital with false labor. °Sometimes, the only way to tell if you are in true labor is for your health care provider to look for changes in the cervix. The health care provider will do a physical exam and may monitor your contractions. If you are not in true labor, the exam should show that your cervix is not dilating and your water has not broken. °If there are no other health problems associated with your pregnancy, it is completely safe for you to be sent home with false labor. You may continue to have Braxton Hicks contractions until you go into true labor. °How to tell the difference between true labor and false labor °True labor °· Contractions last 30-70 seconds. °· Contractions become very regular. °· Discomfort is usually felt in the top of the uterus, and it spreads to the lower abdomen and low back. °· Contractions do not go away with walking. °· Contractions usually become more intense and increase in frequency. °· The cervix dilates and gets thinner. °False labor °· Contractions are usually shorter and not as strong as true labor contractions. °· Contractions are usually irregular. °· Contractions  are often felt in the front of the lower abdomen and in the groin. °· Contractions may go away when you walk around or change positions while lying down. °· Contractions get weaker and are shorter-lasting as time goes on. °· The cervix usually does not dilate or become thin. °Follow these instructions at home: ° °· Take over-the-counter and prescription medicines only as told by your health care provider. °· Keep up with your usual exercises and follow other instructions from your health care provider. °· Eat and drink lightly if you think you are going into labor. °· If Braxton Hicks contractions are making you uncomfortable: °? Change your position from lying down or resting to walking, or change from walking to resting. °? Sit and rest in a tub of warm water. °? Drink enough fluid to keep your urine pale yellow. Dehydration may cause these contractions. °? Do slow and deep breathing several times an hour. °· Keep all follow-up prenatal visits as told by your health care provider. This is important. °Contact a health care provider if: °· You have a fever. °· You have continuous pain in your abdomen. °Get help right away if: °· Your contractions become stronger, more regular, and closer together. °· You have fluid leaking or gushing from your vagina. °· You pass blood-tinged mucus (bloody show). °· You have bleeding from your vagina. °· You have low back pain that you never had before. °· You feel your baby’s head pushing down and causing pelvic pressure. °· Your baby is not moving inside you as much as it used to. °Summary °· Contractions that occur before labor are   called Braxton Hicks contractions, false labor, or practice contractions. °· Braxton Hicks contractions are usually shorter, weaker, farther apart, and less regular than true labor contractions. True labor contractions usually become progressively stronger and regular, and they become more frequent. °· Manage discomfort from Braxton Hicks contractions  by changing position, resting in a warm bath, drinking plenty of water, or practicing deep breathing. °This information is not intended to replace advice given to you by your health care provider. Make sure you discuss any questions you have with your health care provider. °Document Revised: 07/31/2017 Document Reviewed: 01/01/2017 °Elsevier Patient Education © 2020 [employer] ° °

## 2020-02-29 ENCOUNTER — Other Ambulatory Visit: Payer: Self-pay

## 2020-02-29 ENCOUNTER — Encounter: Payer: Self-pay | Admitting: Certified Nurse Midwife

## 2020-02-29 ENCOUNTER — Ambulatory Visit (INDEPENDENT_AMBULATORY_CARE_PROVIDER_SITE_OTHER): Payer: No Typology Code available for payment source | Admitting: Certified Nurse Midwife

## 2020-02-29 VITALS — BP 107/73 | HR 87 | Wt 266.2 lb

## 2020-02-29 DIAGNOSIS — Z3A38 38 weeks gestation of pregnancy: Secondary | ICD-10-CM

## 2020-02-29 LAB — POCT URINALYSIS DIPSTICK OB
Bilirubin, UA: NEGATIVE
Blood, UA: NEGATIVE
Glucose, UA: NEGATIVE
Ketones, UA: NEGATIVE
Leukocytes, UA: NEGATIVE
Nitrite, UA: NEGATIVE
POC,PROTEIN,UA: NEGATIVE
Spec Grav, UA: 1.025 (ref 1.010–1.025)
Urobilinogen, UA: 0.2 E.U./dL
pH, UA: 5 (ref 5.0–8.0)

## 2020-02-29 NOTE — Patient Instructions (Signed)
Braxton Hicks Contractions °Contractions of the uterus can occur throughout pregnancy, but they are not always a sign that you are in labor. You may have practice contractions called Braxton Hicks contractions. These false labor contractions are sometimes confused with true labor. °What are Braxton Hicks contractions? °Braxton Hicks contractions are tightening movements that occur in the muscles of the uterus before labor. Unlike true labor contractions, these contractions do not result in opening (dilation) and thinning of the cervix. Toward the end of pregnancy (32-34 weeks), Braxton Hicks contractions can happen more often and may become stronger. These contractions are sometimes difficult to tell apart from true labor because they can be very uncomfortable. You should not feel embarrassed if you go to the hospital with false labor. °Sometimes, the only way to tell if you are in true labor is for your health care provider to look for changes in the cervix. The health care provider will do a physical exam and may monitor your contractions. If you are not in true labor, the exam should show that your cervix is not dilating and your water has not broken. °If there are no other health problems associated with your pregnancy, it is completely safe for you to be sent home with false labor. You may continue to have Braxton Hicks contractions until you go into true labor. °How to tell the difference between true labor and false labor °True labor °· Contractions last 30-70 seconds. °· Contractions become very regular. °· Discomfort is usually felt in the top of the uterus, and it spreads to the lower abdomen and low back. °· Contractions do not go away with walking. °· Contractions usually become more intense and increase in frequency. °· The cervix dilates and gets thinner. °False labor °· Contractions are usually shorter and not as strong as true labor contractions. °· Contractions are usually irregular. °· Contractions  are often felt in the front of the lower abdomen and in the groin. °· Contractions may go away when you walk around or change positions while lying down. °· Contractions get weaker and are shorter-lasting as time goes on. °· The cervix usually does not dilate or become thin. °Follow these instructions at home: ° °· Take over-the-counter and prescription medicines only as told by your health care provider. °· Keep up with your usual exercises and follow other instructions from your health care provider. °· Eat and drink lightly if you think you are going into labor. °· If Braxton Hicks contractions are making you uncomfortable: °? Change your position from lying down or resting to walking, or change from walking to resting. °? Sit and rest in a tub of warm water. °? Drink enough fluid to keep your urine pale yellow. Dehydration may cause these contractions. °? Do slow and deep breathing several times an hour. °· Keep all follow-up prenatal visits as told by your health care provider. This is important. °Contact a health care provider if: °· You have a fever. °· You have continuous pain in your abdomen. °Get help right away if: °· Your contractions become stronger, more regular, and closer together. °· You have fluid leaking or gushing from your vagina. °· You pass blood-tinged mucus (bloody show). °· You have bleeding from your vagina. °· You have low back pain that you never had before. °· You feel your baby’s head pushing down and causing pelvic pressure. °· Your baby is not moving inside you as much as it used to. °Summary °· Contractions that occur before labor are   called Braxton Hicks contractions, false labor, or practice contractions. °· Braxton Hicks contractions are usually shorter, weaker, farther apart, and less regular than true labor contractions. True labor contractions usually become progressively stronger and regular, and they become more frequent. °· Manage discomfort from Braxton Hicks contractions  by changing position, resting in a warm bath, drinking plenty of water, or practicing deep breathing. °This information is not intended to replace advice given to you by your health care provider. Make sure you discuss any questions you have with your health care provider. °Document Revised: 07/31/2017 Document Reviewed: 01/01/2017 °Elsevier Patient Education © 2020 [employer] ° °

## 2020-02-29 NOTE — Progress Notes (Signed)
ROB doing well. No complaints . Feels good movement. Labor precautions reviewed. Follow up 1 wk.   Doreene Burke, CNM

## 2020-03-01 ENCOUNTER — Observation Stay
Admission: EM | Admit: 2020-03-01 | Discharge: 2020-03-01 | Disposition: A | Discharge status: Home / Self Care | Payer: Medicaid Other | Attending: Certified Nurse Midwife | Admitting: Certified Nurse Midwife

## 2020-03-01 ENCOUNTER — Telehealth: Payer: Self-pay

## 2020-03-01 DIAGNOSIS — O99013 Anemia complicating pregnancy, third trimester: Secondary | ICD-10-CM | POA: Diagnosis not present

## 2020-03-01 DIAGNOSIS — Z87892 Personal history of anaphylaxis: Secondary | ICD-10-CM | POA: Diagnosis not present

## 2020-03-01 DIAGNOSIS — Z8619 Personal history of other infectious and parasitic diseases: Secondary | ICD-10-CM | POA: Diagnosis not present

## 2020-03-01 DIAGNOSIS — O471 False labor at or after 37 completed weeks of gestation: Principal | ICD-10-CM

## 2020-03-01 DIAGNOSIS — Z3A38 38 weeks gestation of pregnancy: Secondary | ICD-10-CM

## 2020-03-01 DIAGNOSIS — E669 Obesity, unspecified: Secondary | ICD-10-CM | POA: Diagnosis not present

## 2020-03-01 DIAGNOSIS — Z91013 Allergy to seafood: Secondary | ICD-10-CM | POA: Diagnosis not present

## 2020-03-01 DIAGNOSIS — O99213 Obesity complicating pregnancy, third trimester: Secondary | ICD-10-CM | POA: Insufficient documentation

## 2020-03-01 NOTE — Progress Notes (Signed)
SVE done, see exam note. Patient continues to deny contractions so patient was given discharge instructions, and labor precautions. Patient and significant other reassured and discharged ambulatory

## 2020-03-01 NOTE — Telephone Encounter (Signed)
A call was placed to patient to check on her progress with her contractions. She states she is doing ok and declined offer to come in to the office for a cervical check. She was encouraged to reach out to [place] if she changed her mind or if she had any further concerns. A TOC for chlamydia was obtained and sent to [facility] at yesterdays appointment.

## 2020-03-01 NOTE — OB Triage Note (Signed)
Patient presents with complaint of having some contractions at home that lasted for about an hour . States now she is not having any. Denies bleeding or leaking of fluid. Monitors applied.

## 2020-03-02 LAB — GC/CHLAMYDIA PROBE AMP
Chlamydia trachomatis, NAA: NEGATIVE
Neisseria Gonorrhoeae by PCR: NEGATIVE

## 2020-03-07 ENCOUNTER — Encounter: Payer: Self-pay | Admitting: Certified Nurse Midwife

## 2020-03-07 ENCOUNTER — Ambulatory Visit (INDEPENDENT_AMBULATORY_CARE_PROVIDER_SITE_OTHER): Payer: No Typology Code available for payment source | Admitting: Certified Nurse Midwife

## 2020-03-07 VITALS — BP 108/73 | HR 80 | Wt 272.6 lb

## 2020-03-07 DIAGNOSIS — Z3A38 38 weeks gestation of pregnancy: Secondary | ICD-10-CM

## 2020-03-07 DIAGNOSIS — Z3A39 39 weeks gestation of pregnancy: Secondary | ICD-10-CM

## 2020-03-07 DIAGNOSIS — O471 False labor at or after 37 completed weeks of gestation: Secondary | ICD-10-CM

## 2020-03-07 LAB — POCT URINALYSIS DIPSTICK OB
Bilirubin, UA: NEGATIVE
Blood, UA: NEGATIVE
Glucose, UA: NEGATIVE
Ketones, UA: NEGATIVE
Leukocytes, UA: NEGATIVE
Nitrite, UA: NEGATIVE
POC,PROTEIN,UA: NEGATIVE
Spec Grav, UA: 1.01 (ref 1.010–1.025)
Urobilinogen, UA: 0.2 E.U./dL
pH, UA: 5 (ref 5.0–8.0)

## 2020-03-07 NOTE — Addendum Note (Signed)
Addended by: Mechele Claude on: 03/07/2020 12:07 PM   Modules accepted: Orders, SmartSet

## 2020-03-07 NOTE — Discharge Summary (Signed)
Obstetric Discharge Summary  Patient ID: Lisa Hogan MRN: [id number] DOB/AGE: Jan 13, 1996 24 y.o.   Date of Admission: 03/01/2020  Date of Discharge: 03/01/2020  Admitting Diagnosis: Observation at [redacted]w[redacted]d  Secondary Diagnosis: History of chlamydia in third trimester of pregnancy,  Anemia in pregnancy and Obesity in pregnancy     Discharge Diagnosis: No other diagnosis   Antepartum Procedures: NST   Brief Hospital Course   L&D OB Triage Note  Lisa Hogan is a 24 y.o. G1P0 female at [redacted]w[redacted]d, EDD Estimated Date of Delivery: 03/13/20 who presented to triage for complaints of irregular uterine contractions.  She was evaluated by the nurses with no significant findings for labor or fetal distress. Vital signs stable. An NST was performed and has been reviewed by Lisa Hogan. No treatment was required.   NST INTERPRETATION: Indications: rule out uterine contractions  Mode: External Baseline Rate (A): 150 bpm Variability: Moderate Accelerations: 15 x 15 Decelerations: None Contraction Frequency (min): none  Impression: reactive  Dilation: 2 Effacement (%): 80 Cervical Position: Posterior Station: -2 Exam by:: gck  Plan: NST performed was reviewed and was found to be reactive. She was discharged home with bleeding/labor precautions.  Continue routine prenatal care. Follow up with OB/GYN as previously scheduled.     Lisa Hogan, Lisa Hogan    Discharge Instructions: Per After Visit Summary.  Activity: Advance as tolerated. Also refer to After Visit Summary  Diet: Regular  Medications: Allergies as of 03/01/2020      Reactions   Shellfish Allergy Anaphylaxis      Medication List    ASK your doctor about these medications   acetaminophen 325 MG tablet Commonly known as: TYLENOL Take 500 mg by mouth every 6 (six) hours as needed.   albuterol 108 (90 Base) MCG/ACT inhaler Commonly known as: VENTOLIN HFA Inhale 2 puffs into the lungs every 6 (six) hours as needed for wheezing or  shortness of breath.   aspirin EC 81 MG tablet Take 1 tablet (81 mg total) by mouth daily.   EPINEPHrine 0.3 mg/0.3 mL Soaj injection Commonly known as: EPI-PEN Inject into the muscle.   multivitamin-prenatal 27-0.8 MG Tabs tablet Take 1 tablet by mouth daily at 12 noon.      Outpatient follow up:   Follow-up Information    [facility] Follow up.   Why: keep appointment scheduled Contact information: [address]  [address] [place] [address] [phone]              Discharged Condition: stable  Discharged to: home   Lisa Hogan, Lisa Hogan  [facility], [facility]

## 2020-03-07 NOTE — Progress Notes (Signed)
Body mass index is 40.25 kg/m.   ROB doing well. Feels good movement. BMI 40.25 discussed recommendation for indcution due to elevated BMI, is will discuss with signifigant other and let me know. SVE 3/80/-2. Vertex. Labor precuations reviewed. Disucssed u/s growth/afi and rob in 1 wk if no labor or not scheduled for induction. She verbalizes and agrees to plan. Folow up 1 wk.   Doreene Burke, CNM

## 2020-03-07 NOTE — Patient Instructions (Signed)
Braxton Hicks Contractions °Contractions of the uterus can occur throughout pregnancy, but they are not always a sign that you are in labor. You may have practice contractions called Braxton Hicks contractions. These false labor contractions are sometimes confused with true labor. °What are Braxton Hicks contractions? °Braxton Hicks contractions are tightening movements that occur in the muscles of the uterus before labor. Unlike true labor contractions, these contractions do not result in opening (dilation) and thinning of the cervix. Toward the end of pregnancy (32-34 weeks), Braxton Hicks contractions can happen more often and may become stronger. These contractions are sometimes difficult to tell apart from true labor because they can be very uncomfortable. You should not feel embarrassed if you go to the hospital with false labor. °Sometimes, the only way to tell if you are in true labor is for your health care provider to look for changes in the cervix. The health care provider will do a physical exam and may monitor your contractions. If you are not in true labor, the exam should show that your cervix is not dilating and your water has not broken. °If there are no other health problems associated with your pregnancy, it is completely safe for you to be sent home with false labor. You may continue to have Braxton Hicks contractions until you go into true labor. °How to tell the difference between true labor and false labor °True labor °· Contractions last 30-70 seconds. °· Contractions become very regular. °· Discomfort is usually felt in the top of the uterus, and it spreads to the lower abdomen and low back. °· Contractions do not go away with walking. °· Contractions usually become more intense and increase in frequency. °· The cervix dilates and gets thinner. °False labor °· Contractions are usually shorter and not as strong as true labor contractions. °· Contractions are usually irregular. °· Contractions  are often felt in the front of the lower abdomen and in the groin. °· Contractions may go away when you walk around or change positions while lying down. °· Contractions get weaker and are shorter-lasting as time goes on. °· The cervix usually does not dilate or become thin. °Follow these instructions at home: ° °· Take over-the-counter and prescription medicines only as told by your health care provider. °· Keep up with your usual exercises and follow other instructions from your health care provider. °· Eat and drink lightly if you think you are going into labor. °· If Braxton Hicks contractions are making you uncomfortable: °? Change your position from lying down or resting to walking, or change from walking to resting. °? Sit and rest in a tub of warm water. °? Drink enough fluid to keep your urine pale yellow. Dehydration may cause these contractions. °? Do slow and deep breathing several times an hour. °· Keep all follow-up prenatal visits as told by your health care provider. This is important. °Contact a health care provider if: °· You have a fever. °· You have continuous pain in your abdomen. °Get help right away if: °· Your contractions become stronger, more regular, and closer together. °· You have fluid leaking or gushing from your vagina. °· You pass blood-tinged mucus (bloody show). °· You have bleeding from your vagina. °· You have low back pain that you never had before. °· You feel your baby’s head pushing down and causing pelvic pressure. °· Your baby is not moving inside you as much as it used to. °Summary °· Contractions that occur before labor are   called Braxton Hicks contractions, false labor, or practice contractions. °· Braxton Hicks contractions are usually shorter, weaker, farther apart, and less regular than true labor contractions. True labor contractions usually become progressively stronger and regular, and they become more frequent. °· Manage discomfort from Braxton Hicks contractions  by changing position, resting in a warm bath, drinking plenty of water, or practicing deep breathing. °This information is not intended to replace advice given to you by your health care provider. Make sure you discuss any questions you have with your health care provider. °Document Revised: 07/31/2017 Document Reviewed: 01/01/2017 °Elsevier Patient Education © 2020 [employer] ° °

## 2020-03-08 ENCOUNTER — Other Ambulatory Visit
Admission: RE | Admit: 2020-03-08 | Discharge: 2020-03-08 | Disposition: A | Discharge status: Home / Self Care | Payer: Medicaid Other | Source: Ambulatory Visit | Attending: Certified Nurse Midwife | Admitting: Certified Nurse Midwife

## 2020-03-08 ENCOUNTER — Other Ambulatory Visit: Payer: Self-pay

## 2020-03-08 DIAGNOSIS — Z01812 Encounter for preprocedural laboratory examination: Secondary | ICD-10-CM | POA: Diagnosis not present

## 2020-03-08 DIAGNOSIS — Z20822 Contact with and (suspected) exposure to covid-19: Secondary | ICD-10-CM | POA: Insufficient documentation

## 2020-03-08 LAB — SARS CORONAVIRUS 2 (TAT 6-24 HRS): SARS Coronavirus 2: NEGATIVE

## 2020-03-10 ENCOUNTER — Other Ambulatory Visit: Payer: Self-pay

## 2020-03-10 ENCOUNTER — Encounter: Payer: Self-pay | Admitting: Certified Nurse Midwife

## 2020-03-10 ENCOUNTER — Inpatient Hospital Stay: Payer: Medicaid Other | Admitting: Anesthesiology

## 2020-03-10 ENCOUNTER — Inpatient Hospital Stay
Admission: EM | Admit: 2020-03-10 | Discharge: 2020-03-12 | DRG: 807 | Disposition: A | Discharge status: Home / Self Care | Payer: Medicaid Other | Attending: Certified Nurse Midwife | Admitting: Certified Nurse Midwife

## 2020-03-10 DIAGNOSIS — O99214 Obesity complicating childbirth: Secondary | ICD-10-CM | POA: Diagnosis present

## 2020-03-10 DIAGNOSIS — Z3A39 39 weeks gestation of pregnancy: Secondary | ICD-10-CM

## 2020-03-10 DIAGNOSIS — E669 Obesity, unspecified: Secondary | ICD-10-CM | POA: Diagnosis present

## 2020-03-10 LAB — CBC
HCT: 31.8 % — ABNORMAL LOW (ref 36.0–46.0)
Hemoglobin: 10.3 g/dL — ABNORMAL LOW (ref 12.0–15.0)
MCH: 25.4 pg — ABNORMAL LOW (ref 26.0–34.0)
MCHC: 32.4 g/dL (ref 30.0–36.0)
MCV: 78.5 fL — ABNORMAL LOW (ref 80.0–100.0)
Platelets: 209 10*3/uL (ref 150–400)
RBC: 4.05 MIL/uL (ref 3.87–5.11)
RDW: 13.3 % (ref 11.5–15.5)
WBC: 10.3 10*3/uL (ref 4.0–10.5)
nRBC: 0 % (ref 0.0–0.2)

## 2020-03-10 LAB — TYPE AND SCREEN
ABO/RH(D): O POS
Antibody Screen: NEGATIVE

## 2020-03-10 LAB — RPR: RPR Ser Ql: NONREACTIVE

## 2020-03-10 MED ORDER — ACETAMINOPHEN 325 MG PO TABS: 650.0000 mg | ORAL_TABLET | ORAL | Status: DC | PRN

## 2020-03-10 MED ORDER — METHYLERGONOVINE MALEATE 0.2 MG/ML IJ SOLN: 0.2000 mg | INTRAMUSCULAR | Status: DC | PRN

## 2020-03-10 MED ORDER — WITCH HAZEL-GLYCERIN EX PADS: 1.0000 "application " | MEDICATED_PAD | CUTANEOUS | Status: DC | PRN

## 2020-03-10 MED ORDER — MISOPROSTOL 200 MCG PO TABS
ORAL_TABLET | ORAL | Status: AC
  Filled 2020-03-10: qty 4

## 2020-03-10 MED ORDER — DIPHENHYDRAMINE HCL 50 MG/ML IJ SOLN: 12.5000 mg | INTRAMUSCULAR | Status: DC | PRN

## 2020-03-10 MED ORDER — EPHEDRINE 5 MG/ML INJ
10.0000 mg | INTRAVENOUS | Status: DC | PRN
  Filled 2020-03-10: qty 2

## 2020-03-10 MED ORDER — PHENYLEPHRINE 40 MCG/ML (10ML) SYRINGE FOR IV PUSH (FOR BLOOD PRESSURE SUPPORT)
80.0000 ug | PREFILLED_SYRINGE | INTRAVENOUS | Status: DC | PRN
  Filled 2020-03-10: qty 10

## 2020-03-10 MED ORDER — OXYTOCIN-SODIUM CHLORIDE 30-0.9 UT/500ML-% IV SOLN
2.5000 [IU]/h | INTRAVENOUS | Status: DC
  Administered 2020-03-10: 2.5 [IU]/h via INTRAVENOUS
  Filled 2020-03-10: qty 1000

## 2020-03-10 MED ORDER — AMMONIA AROMATIC IN INHA
RESPIRATORY_TRACT | Status: AC
  Filled 2020-03-10: qty 10

## 2020-03-10 MED ORDER — LIDOCAINE HCL (PF) 1 % IJ SOLN
INTRAMUSCULAR | Status: DC | PRN
  Administered 2020-03-10: 1 mL

## 2020-03-10 MED ORDER — MISOPROSTOL 50MCG HALF TABLET
50.0000 ug | ORAL_TABLET | ORAL | Status: DC
  Administered 2020-03-10: 50 ug via VAGINAL
  Filled 2020-03-10: qty 1

## 2020-03-10 MED ORDER — COCONUT OIL OIL
1.0000 "application " | TOPICAL_OIL | Status: DC | PRN
  Filled 2020-03-10: qty 120

## 2020-03-10 MED ORDER — OXYCODONE-ACETAMINOPHEN 5-325 MG PO TABS: 1.0000 | ORAL_TABLET | ORAL | Status: DC | PRN

## 2020-03-10 MED ORDER — FERROUS SULFATE 325 (65 FE) MG PO TABS
325.0000 mg | ORAL_TABLET | Freq: Every day | ORAL | Status: DC
  Administered 2020-03-11 – 2020-03-12 (×2): 325 mg via ORAL
  Filled 2020-03-10 (×2): qty 1

## 2020-03-10 MED ORDER — FENTANYL 2.5 MCG/ML W/ROPIVACAINE 0.15% IN NS 100 ML EPIDURAL (ARMC)
12.0000 mL/h | EPIDURAL | Status: DC
  Administered 2020-03-10: 12 mL/h via EPIDURAL

## 2020-03-10 MED ORDER — SIMETHICONE 80 MG PO CHEW: 80.0000 mg | CHEWABLE_TABLET | ORAL | Status: DC | PRN

## 2020-03-10 MED ORDER — BUTORPHANOL TARTRATE 1 MG/ML IJ SOLN: 1.0000 mg | INTRAMUSCULAR | Status: DC | PRN

## 2020-03-10 MED ORDER — ONDANSETRON HCL 4 MG/2ML IJ SOLN: 4.0000 mg | Freq: Four times a day (QID) | INTRAMUSCULAR | Status: DC | PRN

## 2020-03-10 MED ORDER — DOCUSATE SODIUM 100 MG PO CAPS
100.0000 mg | ORAL_CAPSULE | Freq: Two times a day (BID) | ORAL | Status: DC
  Administered 2020-03-10 – 2020-03-12 (×4): 100 mg via ORAL
  Filled 2020-03-10 (×4): qty 1

## 2020-03-10 MED ORDER — OXYTOCIN BOLUS FROM INFUSION
333.0000 mL | Freq: Once | INTRAVENOUS | Status: AC
  Administered 2020-03-10: 333 mL via INTRAVENOUS

## 2020-03-10 MED ORDER — BENZOCAINE-MENTHOL 20-0.5 % EX AERO
1.0000 "application " | INHALATION_SPRAY | CUTANEOUS | Status: DC | PRN
  Filled 2020-03-10: qty 56

## 2020-03-10 MED ORDER — PRENATAL MULTIVITAMIN CH
1.0000 | ORAL_TABLET | Freq: Every day | ORAL | Status: DC
  Administered 2020-03-10 – 2020-03-11 (×2): 1 via ORAL
  Filled 2020-03-10 (×2): qty 1

## 2020-03-10 MED ORDER — ONDANSETRON HCL 4 MG/2ML IJ SOLN: 4.0000 mg | INTRAMUSCULAR | Status: DC | PRN

## 2020-03-10 MED ORDER — SODIUM CHLORIDE 0.9 % IV SOLN
INTRAVENOUS | Status: DC | PRN
  Administered 2020-03-10 (×3): 5 mL via EPIDURAL

## 2020-03-10 MED ORDER — SENNOSIDES-DOCUSATE SODIUM 8.6-50 MG PO TABS
2.0000 | ORAL_TABLET | ORAL | Status: DC
  Administered 2020-03-10 – 2020-03-11 (×2): 2 via ORAL
  Filled 2020-03-10 (×2): qty 2

## 2020-03-10 MED ORDER — LACTATED RINGERS IV SOLN: INTRAVENOUS | Status: DC

## 2020-03-10 MED ORDER — ONDANSETRON HCL 4 MG PO TABS: 4.0000 mg | ORAL_TABLET | ORAL | Status: DC | PRN

## 2020-03-10 MED ORDER — LIDOCAINE HCL (PF) 1 % IJ SOLN: 30.0000 mL | INTRAMUSCULAR | Status: DC | PRN

## 2020-03-10 MED ORDER — TERBUTALINE SULFATE 1 MG/ML IJ SOLN: 0.2500 mg | Freq: Once | INTRAMUSCULAR | Status: DC | PRN

## 2020-03-10 MED ORDER — IBUPROFEN 600 MG PO TABS
600.0000 mg | ORAL_TABLET | Freq: Four times a day (QID) | ORAL | Status: DC
  Administered 2020-03-10 – 2020-03-12 (×7): 600 mg via ORAL
  Filled 2020-03-10 (×7): qty 1

## 2020-03-10 MED ORDER — LACTATED RINGERS IV SOLN
500.0000 mL | Freq: Once | INTRAVENOUS | Status: AC
  Administered 2020-03-10: 500 mL via INTRAVENOUS

## 2020-03-10 MED ORDER — METHYLERGONOVINE MALEATE 0.2 MG PO TABS: 0.2000 mg | ORAL_TABLET | ORAL | Status: DC | PRN

## 2020-03-10 MED ORDER — DIBUCAINE (PERIANAL) 1 % EX OINT: 1.0000 "application " | TOPICAL_OINTMENT | CUTANEOUS | Status: DC | PRN

## 2020-03-10 MED ORDER — OXYTOCIN 10 UNIT/ML IJ SOLN
INTRAMUSCULAR | Status: AC
  Filled 2020-03-10: qty 2

## 2020-03-10 MED ORDER — SOD CITRATE-CITRIC ACID 500-334 MG/5ML PO SOLN: 30.0000 mL | ORAL | Status: DC | PRN

## 2020-03-10 MED ORDER — LIDOCAINE HCL (PF) 1 % IJ SOLN
INTRAMUSCULAR | Status: AC
  Filled 2020-03-10: qty 30

## 2020-03-10 MED ORDER — OXYCODONE-ACETAMINOPHEN 5-325 MG PO TABS: 2.0000 | ORAL_TABLET | ORAL | Status: DC | PRN

## 2020-03-10 MED ORDER — LACTATED RINGERS IV SOLN: 500.0000 mL | INTRAVENOUS | Status: DC | PRN

## 2020-03-10 MED ORDER — FENTANYL 2.5 MCG/ML W/ROPIVACAINE 0.15% IN NS 100 ML EPIDURAL (ARMC)
EPIDURAL | Status: AC
  Filled 2020-03-10: qty 100

## 2020-03-10 NOTE — H&P (Signed)
History and Physical   HPI  Lisa Hogan is a 24 y.o. G1P0 at [redacted]w[redacted]d Estimated Date of Delivery: 03/13/20 who is being admitted for induction of labor due to elevated BMI in pregnancy.    OB History  OB History  Gravida Para Term Preterm AB Living  1 0 0 0 0 0  SAB TAB Ectopic Multiple Live Births  0 0 0 0 0    # Outcome Date GA Lbr Len/2nd Weight Sex Delivery Anes PTL Lv  1 Current             PROBLEM LIST  Pregnancy complications or risks: Patient Active Problem List   Diagnosis Date Noted  . False labor after 37 completed weeks of gestation   . [redacted] weeks gestation of pregnancy   . Indication for care in labor and delivery, antepartum 03/01/2020  . Labor and delivery, indication for care 01/02/2020  . Anemia in pregnancy 12/24/2019  . Obesity in pregnancy 12/24/2019  . Type O blood, Rh positive 12/24/2019    Prenatal labs and studies: ABO, Rh: --/--/O POS (07/10 0107) Antibody: NEG (07/10 0107) Rubella: 3.59 (12/07 1040) RPR: Non Reactive (04/22 0817)  HBsAg: Negative (12/07 1040)  HIV: Non Reactive (12/07 1040)  UVO:ZDGUYQIH/-- (06/16 1218)   Past Medical History:  Diagnosis Date  . Anxiety   . Asthma      Past Surgical History:  Procedure Laterality Date  . NO PAST SURGERIES       Medications    Current Discharge Medication List    CONTINUE these medications which have NOT CHANGED   Details  acetaminophen (TYLENOL) 325 MG tablet Take 500 mg by mouth every 6 (six) hours as needed.    aspirin EC 81 MG tablet Take 1 tablet (81 mg total) by mouth daily. Qty: 30 tablet, Refills: 7    Prenatal Vit-Fe Fumarate-FA (MULTIVITAMIN-PRENATAL) 27-0.8 MG TABS tablet Take 1 tablet by mouth daily at 12 noon.    albuterol (PROVENTIL HFA;VENTOLIN HFA) 108 (90 BASE) MCG/ACT inhaler Inhale 2 puffs into the lungs every 6 (six) hours as needed for wheezing or shortness of breath.    EPINEPHrine 0.3 mg/0.3 mL IJ SOAJ injection Inject into the muscle.           Allergies  Shellfish allergy  Review of Systems  Constitutional: negative Eyes: negative Ears, nose, mouth, throat, and face: negative Respiratory: negative Cardiovascular: negative Gastrointestinal: negative Genitourinary:negative Integument/breast: negative Hematologic/lymphatic: negative Musculoskeletal:negative Neurological: negative Behavioral/Psych: negative Endocrine: negative Allergic/Immunologic: negative  Physical Exam  BP (!) 106/54   Pulse 88   Temp 98.4 F (36.9 C) (Oral)   Resp 18   Ht 5\' 9"  (1.753 m)   Wt 123.4 kg   LMP 06/07/2019 (Exact Date)   SpO2 98%   BMI 40.17 kg/m   Lungs:  CTA B Cardio: RRR without M/R/G Abd: Soft, gravid, NT Presentation: cephalic EXT: No C/C/ 1+ Edema DTRs: 2+ B CERVIX: Dilation: Lip/rim Effacement (%): 100 Cervical Position: Middle Station: 0, Plus 1 Presentation: Vertex Exam by:: [email or web address] RN  See Prenatal records for more detailed PE.     FHR:  Baseline: 135 bpm, Variability: Good {> 6 bpm), Accelerations: Reactive and Decelerations: Absent  Toco: Uterine Contractions: Frequency: Every 2-3 minutes and Intensity: moderate  Test Results  Results for orders placed or performed during the hospital encounter of 03/10/20 (from the past 24 hour(s))  CBC     Status: Abnormal   Collection Time: 03/10/20  1:07 AM  Result Value Ref Range   WBC 10.3 4.0 - 10.5 K/uL   RBC 4.05 3.87 - 5.11 MIL/uL   Hemoglobin 10.3 (L) 12.0 - 15.0 g/dL   HCT 65.9 (L) 36 - 46 %   MCV 78.5 (L) 80.0 - 100.0 fL   MCH 25.4 (L) 26.0 - 34.0 pg   MCHC 32.4 30.0 - 36.0 g/dL   RDW 93.5 70.1 - 77.9 %   Platelets 209 150 - 400 K/uL   nRBC 0.0 0.0 - 0.2 %  Type and screen     Status: None   Collection Time: 03/10/20  1:07 AM  Result Value Ref Range   ABO/RH(D) O POS    Antibody Screen NEG    Sample Expiration      03/13/2020,2359 Performed at [facility], [address] [place], [place] [address]    Group B Strep  negative  Assessment   G1P0 at [redacted]w[redacted]d Estimated Date of Delivery: 03/13/20  The fetus is reassuring.   Patient Active Problem List   Diagnosis Date Noted  . False labor after 37 completed weeks of gestation   . [redacted] weeks gestation of pregnancy   . Indication for care in labor and delivery, antepartum 03/01/2020  . Labor and delivery, indication for care 01/02/2020  . Anemia in pregnancy 12/24/2019  . Obesity in pregnancy 12/24/2019  . Type O blood, Rh positive 12/24/2019    Plan  1. Admitted to L&D :   2. EFM:-- Category 1 3. Stadol or Epidural if desired.   4. Admission labs completed 5. Anticipate NSVD  Doreene Burke, CNM  03/10/2020 7:42 AM

## 2020-03-10 NOTE — Progress Notes (Signed)
LABOR NOTE   Lisa Hogan 24 y.o.@ at [redacted]w[redacted]d  SUBJECTIVE:  Comfortable with mild pressure that comes and goes. Analgesia: Epidural  OBJECTIVE:  BP (!) 106/54    Pulse 88    Temp 98.4 F (36.9 C) (Oral)    Resp 18    Ht 5\' 9"  (1.753 m)    Wt 123.4 kg    LMP 06/07/2019 (Exact Date)    SpO2 98%    BMI 40.17 kg/m  No intake/output data recorded.  She has shown cervical change. CERVIX: 10cm:  100%:   +1:    SVE:   Dilation: 10 Effacement (%): 100 Station: Plus 1 Exam by:: Demba Nigh  CONTRACTIONS: regular, every 2-3 minutes FHR: Fetal heart tracing reviewed. Baseline: 140 bpm, Variability: Good {> 6 bpm), Accelerations: Reactive and Decelerations: Absent Category I    Labs: Lab Results  Component Value Date   WBC 10.3 03/10/2020   HGB 10.3 (L) 03/10/2020   HCT 31.8 (L) 03/10/2020   MCV 78.5 (L) 03/10/2020   PLT 209 03/10/2020    ASSESSMENT: 1) Labor curve reviewed.       Progress: Active phase labor.     Membranes: ruptured, clear fluid           Active Problems:   Labor and delivery, indication for care Obesity   PLAN: expectant management and start pushing    05/11/2020, [facility]  03/10/2020 7:53 AM

## 2020-03-10 NOTE — Progress Notes (Signed)
Pt is a G1P0 and [redacted]w[redacted]d and arrived to L&D for scheduled elective IOL. Pt denies any vaginal bleeding, LOF and confirms positive fetal movement. Monitors applied and assessing. VSS. 2 support people at bedside. Consents verified. Pt denies any further needs at this time.

## 2020-03-10 NOTE — Anesthesia Preprocedure Evaluation (Signed)
Anesthesia Evaluation  Patient identified by MRN, date of birth, ID band Patient awake    Reviewed: Allergy & Precautions, H&P , NPO status , Patient's Chart, lab work & pertinent test results  Airway Mallampati: III       Dental  (+) Teeth Intact   Pulmonary asthma ,           Cardiovascular Exercise Tolerance: Good negative cardio ROS       Neuro/Psych Anxiety    GI/Hepatic negative GI ROS,   Endo/Other    Renal/GU   negative genitourinary   Musculoskeletal   Abdominal   Peds  Hematology  (+) Blood dyscrasia, anemia ,   Anesthesia Other Findings Obese Past Medical History: No date: Anxiety No date: Asthma  Past Surgical History: No date: NO PAST SURGERIES  BMI    Body Mass Index: 40.17 kg/m      Reproductive/Obstetrics (+) Pregnancy                             Anesthesia Physical Anesthesia Plan  ASA: III  Anesthesia Plan: Epidural   Post-op Pain Management:    Induction:   PONV Risk Score and Plan:   Airway Management Planned:   Additional Equipment:   Intra-op Plan:   Post-operative Plan:   Informed Consent: I have reviewed the patients History and Physical, chart, labs and discussed the procedure including the risks, benefits and alternatives for the proposed anesthesia with the patient or authorized representative who has indicated his/her understanding and acceptance.       Plan Discussed with: Anesthesiologist  Anesthesia Plan Comments:         Anesthesia Quick Evaluation

## 2020-03-10 NOTE — Anesthesia Procedure Notes (Signed)
Epidural Patient location during procedure: OB Start time: 03/10/2020 3:57 AM End time: 03/10/2020 4:25 AM  Staffing Anesthesiologist: Karleen Hampshire, MD Performed: anesthesiologist   Preanesthetic Checklist Completed: patient identified, IV checked, site marked, risks and benefits discussed, surgical consent, monitors and equipment checked, pre-op evaluation and timeout performed  Epidural Patient position: sitting Prep: ChloraPrep Patient monitoring: heart rate, continuous pulse ox and blood pressure Approach: midline Location: L4-L5 Injection technique: LOR saline  Needle:  Needle type: Tuohy  Needle gauge: 18 G Needle length: 9 cm and 9 Needle insertion depth: 8 cm Catheter type: closed end flexible Catheter size: 20 Guage Catheter at skin depth: 13 cm Test dose: negative and 1.5% lidocaine with Epi 1:200 K  Assessment Events: blood not aspirated, injection not painful, no injection resistance, no paresthesia and negative IV test  Additional Notes Difficulty with proper positioning. Risks and benefits of procedure discussed with patient.  Risks including but not limited to infection, spinal/epidural hematoma, nerve injury, post dural puncture headache, and inadequate/failed block.  Patient expressed understanding and consented to epidural placement. Negative dural puncture.  Negative aspiration.  Negative paresthesia on injection.  Dose given in divided aliquots.  Patient tolerated the procedure well with no immediate complications.   Reason for block:procedure for pain

## 2020-03-10 NOTE — Discharge Instructions (Signed)

## 2020-03-11 LAB — CBC
HCT: 29.1 % — ABNORMAL LOW (ref 36.0–46.0)
Hemoglobin: 9.8 g/dL — ABNORMAL LOW (ref 12.0–15.0)
MCH: 26.2 pg (ref 26.0–34.0)
MCHC: 33.7 g/dL (ref 30.0–36.0)
MCV: 77.8 fL — ABNORMAL LOW (ref 80.0–100.0)
Platelets: 197 10*3/uL (ref 150–400)
RBC: 3.74 MIL/uL — ABNORMAL LOW (ref 3.87–5.11)
RDW: 13.5 % (ref 11.5–15.5)
WBC: 11 10*3/uL — ABNORMAL HIGH (ref 4.0–10.5)
nRBC: 0 % (ref 0.0–0.2)

## 2020-03-11 MED ORDER — FERROUS SULFATE 325 (65 FE) MG PO TABS: 325.0000 mg | ORAL_TABLET | Freq: Every day | ORAL | 3 refills | Status: AC

## 2020-03-11 MED ORDER — MEDROXYPROGESTERONE ACETATE 150 MG/ML IM SUSP: 150.0000 mg | Freq: Once | INTRAMUSCULAR | 3 refills | Status: DC

## 2020-03-11 MED ORDER — MEDROXYPROGESTERONE ACETATE 150 MG/ML IM SUSP
150.0000 mg | Freq: Once | INTRAMUSCULAR | Status: AC
  Administered 2020-03-12: 150 mg via INTRAMUSCULAR
  Filled 2020-03-11 (×2): qty 1

## 2020-03-11 MED ORDER — IBUPROFEN 600 MG PO TABS: 600.0000 mg | ORAL_TABLET | Freq: Four times a day (QID) | ORAL | 0 refills | Status: DC

## 2020-03-11 MED ORDER — DOCUSATE SODIUM 100 MG PO CAPS: 100.0000 mg | ORAL_CAPSULE | Freq: Two times a day (BID) | ORAL | 0 refills | Status: DC

## 2020-03-11 NOTE — Anesthesia Postprocedure Evaluation (Signed)
Anesthesia Post Note  Patient: Lisa Hogan  Procedure(s) Performed: AN AD HOC LABOR EPIDURAL  Patient location during evaluation: Mother Baby Anesthesia Type: Epidural Level of consciousness: awake and alert Pain management: pain level controlled Vital Signs Assessment: post-procedure vital signs reviewed and stable Respiratory status: spontaneous breathing, nonlabored ventilation and respiratory function stable Cardiovascular status: stable Postop Assessment: no headache, no backache and epidural receding Anesthetic complications: no   No complications documented.   Last Vitals:  Vitals:   03/11/20 0013 03/11/20 0926  BP: 110/64 108/88  Pulse: 74 99  Resp: 20 20  Temp: 37 C 36.7 C  SpO2: 100% 100%    Last Pain:  Vitals:   03/11/20 0926  TempSrc: Oral  PainSc:                  Karleen Hampshire

## 2020-03-11 NOTE — Discharge Summary (Addendum)
Patient Name: Lisa Hogan DOB: July 11, 1996 MRN: [id number]                            Discharge Summary  Date of Admission: 03/10/2020 Date of Discharge: 03/11/2020 Delivering Provider: Doreene Burke   Admitting Diagnosis: Labor and delivery, indication for care [O75.9] at [redacted]w[redacted]d Secondary diagnosis:  Active Problems:   Labor and delivery, indication for care  Mode of Delivery: normal spontaneous vaginal delivery              Discharge diagnosis: Term Pregnancy Delivered      Intrapartum Procedures: epidural   Post partum procedures: none  Complications: none                     Discharge Day SOAP Note:  Progress Note - Vaginal Delivery  Lisa Hogan is a 24 y.o. G1P0 now PP day 1 s/p Vaginal, Spontaneous . Delivery was uncomplicated  Subjective  The patient has the following complaints: has no unusual complaints  Pain is controlled with current medications.   Patient is urinating without difficulty.  She is ambulating well.     Objective  Vital signs: BP 110/64    Pulse 74    Temp 98.6 F (37 C) (Oral)    Resp 20    Ht 5\' 9"  (1.753 m)    Wt 123.4 kg    LMP 06/07/2019 (Exact Date)    SpO2 100%    BMI 40.17 kg/m   Physical Exam: Gen: NAD Fundus Fundal Tone: Firm  Lochia Amount: Small        Data Review Labs: Lab Results  Component Value Date   WBC 11.0 (H) 03/11/2020   HGB 9.8 (L) 03/11/2020   HCT 29.1 (L) 03/11/2020   MCV 77.8 (L) 03/11/2020   PLT 197 03/11/2020   CBC Latest Ref Rng & Units 03/11/2020 03/10/2020 12/22/2019  WBC 4.0 - 10.5 K/uL 11.0(H) 10.3 10.5  Hemoglobin 12.0 - 15.0 g/dL 12/24/2019) 10.3(L) 10.7(L)  Hematocrit 36 - 46 % 29.1(L) 31.8(L) 32.4(L)  Platelets 150 - 400 K/uL 197 209 232   O POS  Edinburgh Score: Edinburgh Postnatal Depression Scale Screening Tool 03/10/2020  I have been able to laugh and see the funny side of things. 0  I have looked forward with enjoyment to things. 0  I have blamed myself unnecessarily when things  went wrong. 0  I have been anxious or worried for no good reason. 2  I have felt scared or panicky for no good reason. 0  Things have been getting on top of me. 0  I have been so unhappy that I have had difficulty sleeping. 0  I have felt sad or miserable. 0  I have been so unhappy that I have been crying. 0  The thought of harming myself has occurred to me. 0  Edinburgh Postnatal Depression Scale Total 2    Assessment/Plan  Active Problems:   Labor and delivery, indication for care    Plan for discharge today.  Discharge Instructions: Per After Visit Summary. Activity: Advance as tolerated. Pelvic rest for 6 weeks.  Also refer to After Visit Summary Diet: Regular Medications: Allergies as of 03/11/2020      Reactions   Shellfish Allergy Anaphylaxis      Medication List    STOP taking these medications   aspirin EC 81 MG tablet     TAKE these medications  acetaminophen 325 MG tablet Commonly known as: TYLENOL Take 500 mg by mouth every 6 (six) hours as needed.   albuterol 108 (90 Base) MCG/ACT inhaler Commonly known as: VENTOLIN HFA Inhale 2 puffs into the lungs every 6 (six) hours as needed for wheezing or shortness of breath.   docusate sodium 100 MG capsule Commonly known as: COLACE Take 1 capsule (100 mg total) by mouth 2 (two) times daily.   EPINEPHrine 0.3 mg/0.3 mL Soaj injection Commonly known as: EPI-PEN Inject into the muscle.   ferrous sulfate 325 (65 FE) MG tablet Take 1 tablet (325 mg total) by mouth daily with breakfast.   ibuprofen 600 MG tablet Commonly known as: ADVIL Take 1 tablet (600 mg total) by mouth every 6 (six) hours.   medroxyPROGESTERone 150 MG/ML injection Commonly known as: DEPO-PROVERA Inject 1 mL (150 mg total) into the muscle once for 1 dose.   multivitamin-prenatal 27-0.8 MG Tabs tablet Take 1 tablet by mouth daily at 12 noon.      Outpatient follow up: 2 wks tele visit, 6 week postpartum visit with Doreene Burke,  CNM  Postpartum contraception: Depo injection , first dose at hospital   Discharged Condition: good  Discharged to: home  Newborn Data: Disposition:home with mother  Apgars: APGAR (1 MIN): 8   APGAR (5 MINS): 9   APGAR (10 MINS):    Baby Feeding: Breast    Doreene Burke, CNM  03/11/2020 6:40 AM

## 2020-03-11 NOTE — Final Progress Note (Signed)
Discharge Day SOAP Note:  Progress Note - Vaginal Delivery  Lisa Hogan is a 24 y.o. G1P0 now PP day 1 s/p Vaginal, Spontaneous . Delivery was uncomplicated  Subjective  The patient has the following complaints: has no unusual complaints  Pain is controlled with current medications.   Patient is urinating without difficulty.  She is ambulating well.     Objective  Vital signs: BP 110/64   Pulse 74   Temp 98.6 F (37 C) (Oral)   Resp 20   Ht 5\' 9"  (1.753 m)   Wt 123.4 kg   LMP 06/07/2019 (Exact Date)   SpO2 100%   BMI 40.17 kg/m   Physical Exam: Gen: NAD Fundus Fundal Tone: Firm  Lochia Amount: Small        Data Review Labs: Lab Results  Component Value Date   WBC 11.0 (H) 03/11/2020   HGB 9.8 (L) 03/11/2020   HCT 29.1 (L) 03/11/2020   MCV 77.8 (L) 03/11/2020   PLT 197 03/11/2020   CBC Latest Ref Rng & Units 03/11/2020 03/10/2020 12/22/2019  WBC 4.0 - 10.5 K/uL 11.0(H) 10.3 10.5  Hemoglobin 12.0 - 15.0 g/dL 12/24/2019) 10.3(L) 10.7(L)  Hematocrit 36 - 46 % 29.1(L) 31.8(L) 32.4(L)  Platelets 150 - 400 K/uL 197 209 232   O POS  Edinburgh Score: Edinburgh Postnatal Depression Scale Screening Tool 03/10/2020  I have been able to laugh and see the funny side of things. 0  I have looked forward with enjoyment to things. 0  I have blamed myself unnecessarily when things went wrong. 0  I have been anxious or worried for no good reason. 2  I have felt scared or panicky for no good reason. 0  Things have been getting on top of me. 0  I have been so unhappy that I have had difficulty sleeping. 0  I have felt sad or miserable. 0  I have been so unhappy that I have been crying. 0  The thought of harming myself has occurred to me. 0  Edinburgh Postnatal Depression Scale Total 2    Assessment/Plan  Active Problems:   Labor and delivery, indication for care    Plan for discharge today.  Discharge Instructions: Per After Visit Summary. Activity: Advance as  tolerated. Pelvic rest for 6 weeks.  Also refer to After Visit Summary Diet: Regular Medications: Allergies as of 03/11/2020      Reactions   Shellfish Allergy Anaphylaxis      Medication List    STOP taking these medications   aspirin EC 81 MG tablet     TAKE these medications   acetaminophen 325 MG tablet Commonly known as: TYLENOL Take 500 mg by mouth every 6 (six) hours as needed.   albuterol 108 (90 Base) MCG/ACT inhaler Commonly known as: VENTOLIN HFA Inhale 2 puffs into the lungs every 6 (six) hours as needed for wheezing or shortness of breath.   docusate sodium 100 MG capsule Commonly known as: COLACE Take 1 capsule (100 mg total) by mouth 2 (two) times daily.   EPINEPHrine 0.3 mg/0.3 mL Soaj injection Commonly known as: EPI-PEN Inject into the muscle.   ferrous sulfate 325 (65 FE) MG tablet Take 1 tablet (325 mg total) by mouth daily with breakfast.   ibuprofen 600 MG tablet Commonly known as: ADVIL Take 1 tablet (600 mg total) by mouth every 6 (six) hours.   multivitamin-prenatal 27-0.8 MG Tabs tablet Take 1 tablet by mouth daily at 12 noon.  Outpatient follow up: 2 wks tele visit, 6 week postpartum visit with Doreene Burke, CNM  Postpartum contraception: Depo injection , first dose at hospital   Discharged Condition: good  Discharged to: home  Newborn Data: Disposition:home with mother  Apgars: APGAR (1 MIN): 8   APGAR (5 MINS): 9   APGAR (10 MINS):    Baby Feeding: Breast    Doreene Burke, CNM  03/11/2020 6:37 AM

## 2020-03-12 NOTE — Progress Notes (Signed)
DC to home.  To car with NB

## 2020-03-12 NOTE — Progress Notes (Signed)
   03/12/20 1100  Clinical Encounter Type  Visited With Patient and family together  Visit Type Initial  Referral From Nurse  Consult/Referral To Chaplain  Chaplain briefly visited with patient and significant other, Kendrick. Chaplain talked with them and asked if she could pray with them and they said yes. Chaplain prayed, wished them well, and left.

## 2020-03-12 NOTE — Discharge Summary (Signed)
Patient Name: Lisa Hogan DOB: 12-20-1995 MRN: [id number]                            Discharge Summary  Date of Admission: 03/10/2020 Date of Discharge: 03/12/2020 Delivering Provider: Doreene Burke   Admitting Diagnosis: Labor and delivery, indication for care [O75.9] at [redacted]w[redacted]d Secondary diagnosis:  Active Problems:   Labor and delivery, indication for care Obesity in pregnancy   Mode of Delivery: normal spontaneous vaginal delivery                                               Discharge diagnosis: Term Pregnancy Delivered                 Intrapartum Procedures: epidural              Post partum procedures: none  Complications: none                     Discharge Day SOAP Note:  Progress Note - Vaginal Delivery  Lisa Hogan is a 24 y.o. G1P0 now PP day 2 s/p Vaginal, Spontaneous . Delivery was uncomplicated. She was planning on going home yesterday , discharge was delayed due to fetal indications.   Subjective  The patient has the following complaints: has no unusual complaints  Pain is controlled with current medications.   Patient is urinating without difficulty.  She is ambulating well.     Objective  Vital signs: BP 110/64   Pulse 74   Temp 98.6 F (37 C) (Oral)   Resp 20   Ht 5\' 9"  (1.753 m)   Wt 123.4 kg   LMP 06/07/2019 (Exact Date)   SpO2 100%   BMI 40.17 kg/m   Physical Exam: Gen: NAD Fundus Fundal Tone: Firm  Lochia Amount: Small                   Data Review Labs: Recent Labs       Lab Results  Component Value Date   WBC 11.0 (H) 03/11/2020   HGB 9.8 (L) 03/11/2020   HCT 29.1 (L) 03/11/2020   MCV 77.8 (L) 03/11/2020   PLT 197 03/11/2020     CBC Latest Ref Rng & Units 03/11/2020 03/10/2020 12/22/2019  WBC 4.0 - 10.5 K/uL 11.0(H) 10.3 10.5  Hemoglobin 12.0 - 15.0 g/dL 12/24/2019) 10.3(L) 10.7(L)  Hematocrit 36 - 46 % 29.1(L) 31.8(L) 32.4(L)  Platelets 150 - 400 K/uL 197 209 232   O  POS  Edinburgh Score: Edinburgh Postnatal Depression Scale Screening Tool 03/10/2020  I have been able to laugh and see the funny side of things. 0  I have looked forward with enjoyment to things. 0  I have blamed myself unnecessarily when things went wrong. 0  I have been anxious or worried for no good reason. 2  I have felt scared or panicky for no good reason. 0  Things have been getting on top of me. 0  I have been so unhappy that I have had difficulty sleeping. 0  I have felt sad or miserable. 0  I have been so unhappy that I have been crying. 0  The thought of harming myself has occurred to me. 0  Edinburgh Postnatal Depression Scale Total 2    Assessment/Plan  Active Problems:   Labor and delivery, indication for care    Plan for discharge today.  Discharge Instructions: Per After Visit Summary. Activity: Advance as tolerated. Pelvic rest for 6 weeks.  Also refer to After Visit Summary Diet: Regular Medications:      Allergies as of 03/11/2020      Reactions   Shellfish Allergy Anaphylaxis         Medication List    STOP taking these medications   aspirin EC 81 MG tablet     TAKE these medications   acetaminophen 325 MG tablet Commonly known as: TYLENOL Take 500 mg by mouth every 6 (six) hours as needed.   albuterol 108 (90 Base) MCG/ACT inhaler Commonly known as: VENTOLIN HFA Inhale 2 puffs into the lungs every 6 (six) hours as needed for wheezing or shortness of breath.   docusate sodium 100 MG capsule Commonly known as: COLACE Take 1 capsule (100 mg total) by mouth 2 (two) times daily.   EPINEPHrine 0.3 mg/0.3 mL Soaj injection Commonly known as: EPI-PEN Inject into the muscle.   ferrous sulfate 325 (65 FE) MG tablet Take 1 tablet (325 mg total) by mouth daily with breakfast.   ibuprofen 600 MG tablet Commonly known as: ADVIL Take 1 tablet (600 mg total) by mouth every 6 (six) hours.   medroxyPROGESTERone 150 MG/ML  injection Commonly known as: DEPO-PROVERA Inject 1 mL (150 mg total) into the muscle once for 1 dose.   multivitamin-prenatal 27-0.8 MG Tabs tablet Take 1 tablet by mouth daily at 12 noon.      Outpatient follow up: 2 wks tele visit, 6 week postpartum visit with Doreene Burke, CNM  Postpartum contraception: Depo injection , first dose at hospital   Discharged Condition: good  Discharged to: home  Newborn Data: Disposition:home with mother  Apgars: APGAR (1 MIN): 8   APGAR (5 MINS): 9   APGAR (10 MINS):    Baby Feeding: Breast  Doreene Burke, CNM

## 2020-03-12 NOTE — Progress Notes (Signed)
DC inst reviewed with pt.  Verb care of self for home.  Depo given prior to dc.

## 2020-03-12 NOTE — Final Progress Note (Signed)
Discharge Day SOAP Note:  Progress Note - Vaginal Delivery  Lisa Hogan is a 24 y.o. G1P0 now PP day 2 s/p Vaginal, Spontaneous . Delivery was uncomplicated. She was planning on going home yesterday , discharge was delayed due to fetal indications.   Subjective  The patient has the following complaints: has no unusual complaints  Pain is controlled with current medications.   Patient is urinating without difficulty.  She is ambulating well.     Objective  Vital signs: BP 116/78   Pulse 89   Temp 98.6 F (37 C) (Oral)   Resp 20   Ht 5\' 9"  (1.753 m)   Wt 123.4 kg   LMP 06/07/2019 (Exact Date)   SpO2 100%   BMI 40.17 kg/m   Physical Exam: Gen: NAD Fundus Fundal Tone: Firm  Lochia Amount: Small                   Data Review Labs: Recent Labs       Lab Results  Component Value Date   WBC 11.0 (H) 03/11/2020   HGB 9.8 (L) 03/11/2020   HCT 29.1 (L) 03/11/2020   MCV 77.8 (L) 03/11/2020   PLT 197 03/11/2020     CBC Latest Ref Rng & Units 03/11/2020 03/10/2020 12/22/2019  WBC 4.0 - 10.5 K/uL 11.0(H) 10.3 10.5  Hemoglobin 12.0 - 15.0 g/dL 12/24/2019) 10.3(L) 10.7(L)  Hematocrit 36 - 46 % 29.1(L) 31.8(L) 32.4(L)  Platelets 150 - 400 K/uL 197 209 232   O POS  Edinburgh Score: Edinburgh Postnatal Depression Scale Screening Tool 03/10/2020  I have been able to laugh and see the funny side of things. 0  I have looked forward with enjoyment to things. 0  I have blamed myself unnecessarily when things went wrong. 0  I have been anxious or worried for no good reason. 2  I have felt scared or panicky for no good reason. 0  Things have been getting on top of me. 0  I have been so unhappy that I have had difficulty sleeping. 0  I have felt sad or miserable. 0  I have been so unhappy that I have been crying. 0  The thought of harming myself has occurred to me. 0  Edinburgh Postnatal Depression Scale Total 2    Assessment/Plan  Active  Problems:   Labor and delivery, indication for care    Plan for discharge today.  Discharge Instructions: Per After Visit Summary. Activity: Advance as tolerated. Pelvic rest for 6 weeks.  Also refer to After Visit Summary Diet: Regular Medications:      Allergies as of 03/11/2020      Reactions   Shellfish Allergy Anaphylaxis         Medication List    STOP taking these medications   aspirin EC 81 MG tablet     TAKE these medications   acetaminophen 325 MG tablet Commonly known as: TYLENOL Take 500 mg by mouth every 6 (six) hours as needed.   albuterol 108 (90 Base) MCG/ACT inhaler Commonly known as: VENTOLIN HFA Inhale 2 puffs into the lungs every 6 (six) hours as needed for wheezing or shortness of breath.   docusate sodium 100 MG capsule Commonly known as: COLACE Take 1 capsule (100 mg total) by mouth 2 (two) times daily.   EPINEPHrine 0.3 mg/0.3 mL Soaj injection Commonly known as: EPI-PEN Inject into the muscle.   ferrous sulfate 325 (65 FE) MG tablet Take 1 tablet (325 mg total) by mouth  daily with breakfast.   ibuprofen 600 MG tablet Commonly known as: ADVIL Take 1 tablet (600 mg total) by mouth every 6 (six) hours.   medroxyPROGESTERone 150 MG/ML injection Commonly known as: DEPO-PROVERA Inject 1 mL (150 mg total) into the muscle once for 1 dose.   multivitamin-prenatal 27-0.8 MG Tabs tablet Take 1 tablet by mouth daily at 12 noon.      Outpatient follow up: 2 wks tele visit, 6 week postpartum visit with Doreene Burke, CNM  Postpartum contraception: Depo injection , first dose at hospital   Discharged Condition: good  Discharged to: home  Newborn Data: Disposition:home with mother  Apgars: APGAR (1 MIN): 8   APGAR (5 MINS): 9   APGAR (10 MINS):    Baby Feeding: Breast  Doreene Burke, CNM

## 2020-03-13 ENCOUNTER — Other Ambulatory Visit: Payer: No Typology Code available for payment source

## 2020-03-13 ENCOUNTER — Encounter: Payer: No Typology Code available for payment source | Admitting: Certified Nurse Midwife

## 2020-03-19 ENCOUNTER — Telehealth: Payer: Self-pay

## 2020-03-19 NOTE — Telephone Encounter (Signed)
mychart message sent to patient

## 2020-03-21 ENCOUNTER — Encounter: Payer: Self-pay | Admitting: Certified Nurse Midwife

## 2020-03-21 ENCOUNTER — Other Ambulatory Visit: Payer: Self-pay

## 2020-03-21 ENCOUNTER — Ambulatory Visit (INDEPENDENT_AMBULATORY_CARE_PROVIDER_SITE_OTHER): Payer: No Typology Code available for payment source | Admitting: Certified Nurse Midwife

## 2020-03-21 DIAGNOSIS — Z1331 Encounter for screening for depression: Secondary | ICD-10-CM

## 2020-03-21 NOTE — Progress Notes (Signed)
A call was placed to patient for a 2 week televisit. DOB as identifier. Patient is bottle feeding. Does not have any bleeding. PhQ9= 0. Call transferred to Doreene Burke CNM for completion of televisit.

## 2020-03-21 NOTE — Progress Notes (Signed)
Virtual Visit via Telephone Note  I connected with Lisa Hogan on 03/21/20 at 11:30 AM EDT by telephone and verified that I am speaking with the correct person using two identifiers.   I discussed the limitations, risks, security and privacy concerns of performing an evaluation and management service by telephone and the availability of in person appointments. I also discussed with the patient that there may be a patient responsible charge related to this service. The patient expressed understanding and agreed to proceed.  Present on phone call Lecretia Buczek, pt at home Yevonne Aline , nurse at office Doreene Burke, CNM at office  History of Present Illness: 24 yr old G1P1 2 weeks postpartum mood check.  She reports that she is eating well and sleeping is as to be expected. Averaging a few hrs at a time. She has support from her partner and her mother, Her mother helps keep the baby some during the day which allow her a litltle more sleep. She is voiding well, her bleeding is spotting She states her mood is good.     Observations/Objective:    Office Visit from 03/21/2020 in [facility]  PHQ-9 Total Score 0      Assessment and Plan: Doing well postpartum. Reviewed Signs and symptoms pp depression   Follow Up Instructions: Follow up in office in 4 wks for postpartum visit.    I discussed the assessment and treatment plan with the patient. The patient was provided an opportunity to ask questions and all were answered. The patient agreed with the plan and demonstrated an understanding of the instructions.   The patient was advised to call back or seek an in-person evaluation if the symptoms worsen or if the condition fails to improve as anticipated.  I provided 8 minutes of non-face-to-face time during this encounter.   Doreene Burke, CNM

## 2020-04-17 ENCOUNTER — Encounter: Payer: Self-pay | Admitting: Certified Nurse Midwife

## 2020-04-17 ENCOUNTER — Ambulatory Visit (INDEPENDENT_AMBULATORY_CARE_PROVIDER_SITE_OTHER): Payer: No Typology Code available for payment source | Admitting: Certified Nurse Midwife

## 2020-04-17 ENCOUNTER — Other Ambulatory Visit: Payer: Self-pay

## 2020-04-17 NOTE — Patient Instructions (Signed)
Preventive Care 21-25 Years Old, Female Preventive care refers to visits with your health care provider and lifestyle choices that can promote health and wellness. This includes:  A yearly physical exam. This may also be called an annual well check.  Regular dental visits and eye exams.  Immunizations.  Screening for certain conditions.  Healthy lifestyle choices, such as eating a healthy diet, getting regular exercise, not using drugs or products that contain nicotine and tobacco, and limiting alcohol use. What can I expect for my preventive care visit? Physical exam Your health care provider will check your:  Height and weight. This may be used to calculate body mass index (BMI), which tells if you are at a healthy weight.  Heart rate and blood pressure.  Skin for abnormal spots. Counseling Your health care provider may ask you questions about your:  Alcohol, tobacco, and drug use.  Emotional well-being.  Home and relationship well-being.  Sexual activity.  Eating habits.  Work and work environment.  Method of birth control.  Menstrual cycle.  Pregnancy history. What immunizations do I need?  Influenza (flu) vaccine  This is recommended every year. Tetanus, diphtheria, and pertussis (Tdap) vaccine  You may need a Td booster every 10 years. Varicella (chickenpox) vaccine  You may need this if you have not been vaccinated. Human papillomavirus (HPV) vaccine  If recommended by your health care provider, you may need three doses over 6 months. Measles, mumps, and rubella (MMR) vaccine  You may need at least one dose of MMR. You may also need a second dose. Meningococcal conjugate (MenACWY) vaccine  One dose is recommended if you are age 19-21 years and a first-year college student living in a residence hall, or if you have one of several medical conditions. You may also need additional booster doses. Pneumococcal conjugate (PCV13) vaccine  You may need  this if you have certain conditions and were not previously vaccinated. Pneumococcal polysaccharide (PPSV23) vaccine  You may need one or two doses if you smoke cigarettes or if you have certain conditions. Hepatitis A vaccine  You may need this if you have certain conditions or if you travel or work in places where you may be exposed to hepatitis A. Hepatitis B vaccine  You may need this if you have certain conditions or if you travel or work in places where you may be exposed to hepatitis B. Haemophilus influenzae type b (Hib) vaccine  You may need this if you have certain conditions. You may receive vaccines as individual doses or as more than one vaccine together in one shot (combination vaccines). Talk with your health care provider about the risks and benefits of combination vaccines. What tests do I need?  Blood tests  Lipid and cholesterol levels. These may be checked every 5 years starting at age 20.  Hepatitis C test.  Hepatitis B test. Screening  Diabetes screening. This is done by checking your blood sugar (glucose) after you have not eaten for a while (fasting).  Sexually transmitted disease (STD) testing.  BRCA-related cancer screening. This may be done if you have a family history of breast, ovarian, tubal, or peritoneal cancers.  Pelvic exam and Pap test. This may be done every 3 years starting at age 21. Starting at age 30, this may be done every 5 years if you have a Pap test in combination with an HPV test. Talk with your health care provider about your test results, treatment options, and if necessary, the need for more tests.   Follow these instructions at home: Eating and drinking   Eat a diet that includes fresh fruits and vegetables, whole grains, lean protein, and low-fat dairy.  Take vitamin and mineral supplements as recommended by your health care provider.  Do not drink alcohol if: ? Your health care provider tells you not to drink. ? You are  pregnant, may be pregnant, or are planning to become pregnant.  If you drink alcohol: ? Limit how much you have to 0-1 drink a day. ? Be aware of how much alcohol is in your drink. In the [place] one drink equals one 12 oz bottle of beer (355 mL), one 5 oz glass of wine (148 mL), or one 1 oz glass of hard liquor (44 mL). Lifestyle  Take daily care of your teeth and gums.  Stay active. Exercise for at least 30 minutes on 5 or more days each week.  Do not use any products that contain nicotine or tobacco, such as cigarettes, e-cigarettes, and chewing tobacco. If you need help quitting, ask your health care provider.  If you are sexually active, practice safe sex. Use a condom or other form of birth control (contraception) in order to prevent pregnancy and STIs (sexually transmitted infections). If you plan to become pregnant, see your health care provider for a preconception visit. What's next?  Visit your health care provider once a year for a well check visit.  Ask your health care provider how often you should have your eyes and teeth checked.  Stay up to date on all vaccines. This information is not intended to replace advice given to you by your health care provider. Make sure you discuss any questions you have with your health care provider. Document Revised: 04/29/2018 Document Reviewed: 04/29/2018 Elsevier Patient Education  2020 [employer].

## 2020-04-17 NOTE — Progress Notes (Signed)
Subjective:    Lisa Hogan is a 24 y.o. G1P0 African American female who presents for a postpartum visit. She is 6 weeks postpartum following a spontaneous vaginal delivery at 39.4 gestational weeks. Anesthesia: epidural. I have fully reviewed the prenatal and intrapartum course. Postpartum course has been normal. Baby's course has been normal. Baby is feeding by bottle. Bleeding staining only. Bowel function is normal. Bladder function is normal. Patient is not sexually active. Last sexual activity: normal Contraception method is Depo-Provera injections. Postpartum depression screening: negative. Score 0.  Last pap 08/31/19 and was normal.  The following portions of the patient's history were reviewed and updated as appropriate: allergies, current medications, past medical history, past surgical history and problem list.  Review of Systems Pertinent items are noted in HPI.   Vitals:   04/17/20 1034  BP: 108/70  Pulse: 83  Weight: 250 lb 4.8 oz (113.5 kg)  Height: 5\' 9"  (1.753 m)   No LMP recorded.  Objective:   General:  alert, cooperative and no distress   Breasts:  deferred, no complaints  Lungs: clear to auscultation bilaterally  Heart:  regular rate and rhythm  Abdomen: soft, nontender   Vulva: normal  Vagina: normal vagina  Cervix:  closed  Corpus: Well-involuted  Adnexa:  Non-palpable  Rectal Exam: none hemorrhoids        Assessment:   Postpartum exam 6 wks s/p SVD Bottle feeding Depression screening Contraception counseling   Plan:  : Depo-Provera injections Follow up in: 9 months for annual exam or earlier if needed  , CNM

## 2020-06-08 ENCOUNTER — Ambulatory Visit: Payer: No Typology Code available for payment source

## 2020-08-27 ENCOUNTER — Ambulatory Visit: Payer: No Typology Code available for payment source

## 2021-01-15 ENCOUNTER — Encounter: Payer: Self-pay | Admitting: Certified Nurse Midwife

## 2021-01-15 ENCOUNTER — Encounter: Payer: No Typology Code available for payment source | Admitting: Certified Nurse Midwife

## 2021-01-15 NOTE — Patient Instructions (Incomplete)
Preventive Care 21-26 Years Old, Female Preventive care refers to lifestyle choices and visits with your health care provider that can promote health and wellness. This includes:  A yearly physical exam. This is also called an annual wellness visit.  Regular dental and eye exams.  Immunizations.  Screening for certain conditions.  Healthy lifestyle choices, such as: ? Eating a healthy diet. ? Getting regular exercise. ? Not using drugs or products that contain nicotine and tobacco. ? Limiting alcohol use. What can I expect for my preventive care visit? Physical exam Your health care provider may check your:  Height and weight. These may be used to calculate your BMI (body mass index). BMI is a measurement that tells if you are at a healthy weight.  Heart rate and blood pressure.  Body temperature.  Skin for abnormal spots. Counseling Your health care provider may ask you questions about your:  Past medical problems.  Family's medical history.  Alcohol, tobacco, and drug use.  Emotional well-being.  Home life and relationship well-being.  Sexual activity.  Diet, exercise, and sleep habits.  Work and work environment.  Access to firearms.  Method of birth control.  Menstrual cycle.  Pregnancy history. What immunizations do I need? Vaccines are usually given at various ages, according to a schedule. Your health care provider will recommend vaccines for you based on your age, medical history, and lifestyle or other factors, such as travel or where you work.   What tests do I need? Blood tests  Lipid and cholesterol levels. These may be checked every 5 years starting at age 20.  Hepatitis C test.  Hepatitis B test. Screening  Diabetes screening. This is done by checking your blood sugar (glucose) after you have not eaten for a while (fasting).  STD (sexually transmitted disease) testing, if you are at risk.  BRCA-related cancer screening. This may be  done if you have a family history of breast, ovarian, tubal, or peritoneal cancers.  Pelvic exam and Pap test. This may be done every 3 years starting at age 21. Starting at age 30, this may be done every 5 years if you have a Pap test in combination with an HPV test. Talk with your health care provider about your test results, treatment options, and if necessary, the need for more tests.   Follow these instructions at home: Eating and drinking  Eat a healthy diet that includes fresh fruits and vegetables, whole grains, lean protein, and low-fat dairy products.  Take vitamin and mineral supplements as recommended by your health care provider.  Do not drink alcohol if: ? Your health care provider tells you not to drink. ? You are pregnant, may be pregnant, or are planning to become pregnant.  If you drink alcohol: ? Limit how much you have to 0-1 drink a day. ? Be aware of how much alcohol is in your drink. In the [place] one drink equals one 12 oz bottle of beer (355 mL), one 5 oz glass of wine (148 mL), or one 1 oz glass of hard liquor (44 mL).   Lifestyle  Take daily care of your teeth and gums. Brush your teeth every morning and night with fluoride toothpaste. Floss one time each day.  Stay active. Exercise for at least 30 minutes 5 or more days each week.  Do not use any products that contain nicotine or tobacco, such as cigarettes, e-cigarettes, and chewing tobacco. If you need help quitting, ask your health care provider.  Do not   use drugs.  If you are sexually active, practice safe sex. Use a condom or other form of protection to prevent STIs (sexually transmitted infections).  If you do not wish to become pregnant, use a form of birth control. If you plan to become pregnant, see your health care provider for a prepregnancy visit.  Find healthy ways to cope with stress, such as: ? Meditation, yoga, or listening to music. ? Journaling. ? Talking to a trusted  person. ? Spending time with friends and family. Safety  Always wear your seat belt while driving or riding in a vehicle.  Do not drive: ? If you have been drinking alcohol. Do not ride with someone who has been drinking. ? When you are tired or distracted. ? While texting.  Wear a helmet and other protective equipment during sports activities.  If you have firearms in your house, make sure you follow all gun safety procedures.  Seek help if you have been physically or sexually abused. What's next?  Go to your health care provider once a year for an annual wellness visit.  Ask your health care provider how often you should have your eyes and teeth checked.  Stay up to date on all vaccines. This information is not intended to replace advice given to you by your health care provider. Make sure you discuss any questions you have with your health care provider. Document Revised: 04/15/2020 Document Reviewed: 04/29/2018 Elsevier Patient Education  2021 [employer].  

## 2022-10-21 LAB — MISC LABCORP TEST (SEND OUT): Labcorp test code: 9985

## 2023-04-10 ENCOUNTER — Ambulatory Visit
Admission: RE | Admit: 2023-04-10 | Discharge: 2023-04-10 | Disposition: A | Discharge status: Home / Self Care | Payer: Medicaid Other | Source: Ambulatory Visit

## 2023-04-10 VITALS — BP 129/91 | HR 83 | Temp 99.2°F | Ht 69.0 in | Wt 280.0 lb

## 2023-04-10 DIAGNOSIS — J189 Pneumonia, unspecified organism: Secondary | ICD-10-CM

## 2023-04-10 MED ORDER — PROMETHAZINE-DM 6.25-15 MG/5ML PO SYRP: 5.0000 mL | ORAL_SOLUTION | Freq: Four times a day (QID) | ORAL | 0 refills | Status: DC | PRN

## 2023-04-10 MED ORDER — AEROCHAMBER MV MISC: 2 refills | Status: AC

## 2023-04-10 MED ORDER — BENZONATATE 100 MG PO CAPS: 200.0000 mg | ORAL_CAPSULE | Freq: Three times a day (TID) | ORAL | 0 refills | Status: DC

## 2023-04-10 MED ORDER — PREDNISONE 20 MG PO TABS: 60.0000 mg | ORAL_TABLET | Freq: Every day | ORAL | 0 refills | Status: AC

## 2023-04-10 MED ORDER — ALBUTEROL SULFATE HFA 108 (90 BASE) MCG/ACT IN AERS: 2.0000 | INHALATION_SPRAY | RESPIRATORY_TRACT | 0 refills | Status: AC | PRN

## 2023-04-10 NOTE — ED Triage Notes (Signed)
Pt c/o continued pneumonia. Pt states that she was diagnosed at [facility] emergency room last week.   Pt has been on amoxicillin and has 2 days left  Pt states that she is having continued SOB, wheezing, and cough. Pt has worse symptoms at night and tightness in the upper left.   Pt states that she was not given an inhaler by the ED.   Pt states that she does not feel as bad on the medication but she feels that it is not helping anymore.

## 2023-04-10 NOTE — ED Provider Notes (Signed)
[facility]    CSN: [id number] Arrival date & time: 04/10/23  1654      History   Chief Complaint Chief Complaint  Patient presents with   Wheezing    HPI Lisa Hogan is a 27 y.o. female.   HPI  27 year old female with a past medical history significant for asthma and anxiety presents for evaluation of respiratory complaints.  She was seen at [facility] on 04/02/2023 and diagnosed with left-sided pneumonia.  At that time she tested negative for COVID, influenza, and RSV.  She was discharged home on Augmentin but was not given an inhaler.  Past Medical History:  Diagnosis Date   Anxiety    Asthma     Patient Active Problem List   Diagnosis Date Noted   Obesity 12/24/2019   Type O blood, Rh positive 12/24/2019    Past Surgical History:  Procedure Laterality Date   NO PAST SURGERIES      OB History     Gravida  1   Para      Term      Preterm      AB      Living         SAB      IAB      Ectopic      Multiple      Live Births               Home Medications    Prior to Admission medications   Medication Sig Start Date End Date Taking? Authorizing Provider  acetaminophen (TYLENOL) 325 MG tablet Take 500 mg by mouth every 6 (six) hours as needed.   Yes [provider]  albuterol (VENTOLIN HFA) 108 (90 Base) MCG/ACT inhaler Inhale 2 puffs into the lungs every 4 (four) hours as needed. 04/10/23  Yes Becky Augusta, NP  benzonatate (TESSALON) 100 MG capsule Take 2 capsules (200 mg total) by mouth every 8 (eight) hours. 04/10/23  Yes Becky Augusta, NP  EPINEPHrine 0.3 mg/0.3 mL IJ SOAJ injection Inject into the muscle.  01/28/19  Yes [provider]  ferrous sulfate 325 (65 FE) MG tablet Take 1 tablet (325 mg total) by mouth daily with breakfast. 03/11/20  Yes Doreene Burke, CNM  JUNEL FE 1.5/30 1.5-30 MG-MCG tablet Take 1 tablet by mouth daily.   Yes [provider]  predniSONE (DELTASONE) 20 MG tablet Take  3 tablets (60 mg total) by mouth daily with breakfast for 5 days. 3 tablets daily for 5 days. 04/10/23 04/15/23 Yes Becky Augusta, NP  promethazine-dextromethorphan (PROMETHAZINE-DM) 6.25-15 MG/5ML syrup Take 5 mLs by mouth 4 (four) times daily as needed. 04/10/23  Yes Becky Augusta, NP  Spacer/Aero-Holding Deretha Emory (AEROCHAMBER MV) inhaler Use as instructed 04/10/23  Yes Becky Augusta, NP    Family History Family History  Problem Relation Age of Onset   Seizures Mother    Breast cancer Maternal Grandmother    Cancer Maternal Grandmother    Pancreatic cancer Maternal Grandmother     Social History Social History   Tobacco Use   Smoking status: Never   Smokeless tobacco: Never  Vaping Use   Vaping status: Never Used  Substance Use Topics   Alcohol use: Not Currently    Comment: occasionally   Drug use: No     Allergies   Shellfish allergy   Review of Systems Review of Systems  Constitutional:  Negative for fever.  Respiratory:  Positive for cough, shortness of breath and wheezing.  Physical Exam Triage Vital Signs ED Triage Vitals [04/10/23 1704]  Encounter Vitals Group     BP      Systolic BP Percentile      Diastolic BP Percentile      Pulse      Resp      Temp      Temp src      SpO2      Weight 280 lb (127 kg)     Height 5\' 9"  (1.753 m)     Head Circumference      Peak Flow      Pain Score 0     Pain Loc      Pain Education      Exclude from Growth Chart    No data found.  Updated Vital Signs BP (!) 129/91 (BP Location: Left Arm)   Pulse 83   Temp 99.2 F (37.3 C) (Oral)   Ht 5\' 9"  (1.753 m)   Wt 280 lb (127 kg)   LMP 04/10/2023   SpO2 97%   BMI 41.35 kg/m   Visual Acuity Right Eye Distance:   Left Eye Distance:   Bilateral Distance:    Right Eye Near:   Left Eye Near:    Bilateral Near:     Physical Exam Vitals and nursing note reviewed.  Constitutional:      Appearance: Normal appearance. She is not ill-appearing.  HENT:      Head: Normocephalic and atraumatic.  Cardiovascular:     Rate and Rhythm: Normal rate and regular rhythm.     Pulses: Normal pulses.     Heart sounds: Normal heart sounds. No murmur heard.    No friction rub. No gallop.  Pulmonary:     Effort: Pulmonary effort is normal.     Breath sounds: Wheezing present. No rhonchi or rales.     Comments: Patient has scattered expiratory wheezing. Skin:    General: Skin is warm and dry.     Capillary Refill: Capillary refill takes less than 2 seconds.  Neurological:     General: No focal deficit present.     Mental Status: She is alert and oriented to person, place, and time.      UC Treatments / Results  Labs (all labs ordered are listed, but only abnormal results are displayed) Labs Reviewed - No data to display  EKG   Radiology No results found.  Procedures Procedures (including critical care time)  Medications Ordered in UC Medications - No data to display  Initial Impression / Assessment and Plan / UC Course  I have reviewed the triage vital signs and the nursing notes.  Pertinent labs & imaging results that were available during my care of the patient were reviewed by me and considered in my medical decision making (see chart for details).   Patient is a nontoxic-appearing 27 year old female who was recently diagnosed with pneumonia and is being treated with Augmentin presenting for evaluation of continued respiratory symptoms.  She does have a history of asthma but does not currently have an inhaler as she has not needed one since she was very young.  She does have some expiratory wheezing diffusely to chest auscultation.  She is able to speak in full sentence without dyspnea or tachypnea.  Room air oxygen saturation is 97%.  I will have her finish her Augmentin as previously prescribed and I will prescribe her albuterol inhaler with a spacer so that she can use 1 to 2 puffs every 4-6 hours  as needed for shortness of breath.   Additionally, I will prescribe prednisone 60 mg daily that she can start tomorrow for 5-day burst dose.  Also Tessalon Perles and Promethazine DM cough syrup.  Worsening respiratory symptoms should be reevaluated either at urgent care or in the emergency department.   Final Clinical Impressions(s) / UC Diagnoses   Final diagnoses:  Community acquired pneumonia of left lung, unspecified part of lung     Discharge Instructions      Complete your antibiotic therapy as previously prescribed.  Use the albuterol inhaler, with the spacer, 1 to 2 puffs every 4-6 hours as needed for shortness of breath and wheezing.  Starting tomorrow morning breakfast time take the prednisone 60 mg daily.  He will take it each day for 5 days.  This will decrease inflammation in your lungs and make it easier for you to breathe.  You may use the Tessalon Perles every 8 hours during the day as needed for cough.  Take them with a small sip of water.  They may give you numbness to the base of your tongue or metallic taste in her mouth, this was normal.  Use the Promethazine DM cough syrup at bedtime as needed for cough and congestion.  This medicine will make you drowsy but will help you get some sleep so your body can heal.  If you develop any worsening shortness of breath, shortness breath at rest, feels that he cannot catch your breath, or unable to speak in full sentences need to call 911 or go to the ER.     ED Prescriptions     Medication Sig Dispense Auth. Provider   albuterol (VENTOLIN HFA) 108 (90 Base) MCG/ACT inhaler Inhale 2 puffs into the lungs every 4 (four) hours as needed. 18 g Becky Augusta, NP   Spacer/Aero-Holding Chambers (AEROCHAMBER MV) inhaler Use as instructed 1 each Becky Augusta, NP   benzonatate (TESSALON) 100 MG capsule Take 2 capsules (200 mg total) by mouth every 8 (eight) hours. 21 capsule Becky Augusta, NP   promethazine-dextromethorphan (PROMETHAZINE-DM) 6.25-15 MG/5ML syrup Take 5  mLs by mouth 4 (four) times daily as needed. 118 mL Becky Augusta, NP   predniSONE (DELTASONE) 20 MG tablet Take 3 tablets (60 mg total) by mouth daily with breakfast for 5 days. 3 tablets daily for 5 days. 15 tablet Becky Augusta, NP      PDMP not reviewed this encounter.   Becky Augusta, NP 04/10/23 1719

## 2023-04-10 NOTE — Discharge Instructions (Addendum)
Complete your antibiotic therapy as previously prescribed.  Use the albuterol inhaler, with the spacer, 1 to 2 puffs every 4-6 hours as needed for shortness of breath and wheezing.  Starting tomorrow morning breakfast time take the prednisone 60 mg daily.  He will take it each day for 5 days.  This will decrease inflammation in your lungs and make it easier for you to breathe.  You may use the Tessalon Perles every 8 hours during the day as needed for cough.  Take them with a small sip of water.  They may give you numbness to the base of your tongue or metallic taste in her mouth, this was normal.  Use the Promethazine DM cough syrup at bedtime as needed for cough and congestion.  This medicine will make you drowsy but will help you get some sleep so your body can heal.  If you develop any worsening shortness of breath, shortness breath at rest, feels that he cannot catch your breath, or unable to speak in full sentences need to call 911 or go to the ER.

## 2023-11-29 ENCOUNTER — Ambulatory Visit (INDEPENDENT_AMBULATORY_CARE_PROVIDER_SITE_OTHER)

## 2023-11-29 ENCOUNTER — Ambulatory Visit
Admission: RE | Admit: 2023-11-29 | Discharge: 2023-11-29 | Discharge status: Home / Self Care | Source: Ambulatory Visit | Attending: Physician Assistant

## 2023-11-29 VITALS — BP 126/83 | HR 81 | Temp 98.5°F | Resp 14 | Ht 69.0 in | Wt 280.0 lb

## 2023-11-29 DIAGNOSIS — J209 Acute bronchitis, unspecified: Secondary | ICD-10-CM

## 2023-11-29 DIAGNOSIS — J45901 Unspecified asthma with (acute) exacerbation: Secondary | ICD-10-CM

## 2023-11-29 DIAGNOSIS — R0602 Shortness of breath: Secondary | ICD-10-CM

## 2023-11-29 DIAGNOSIS — R062 Wheezing: Secondary | ICD-10-CM

## 2023-11-29 MED ORDER — PREDNISONE 20 MG PO TABS: 40.0000 mg | ORAL_TABLET | Freq: Every day | ORAL | 0 refills | Status: AC

## 2023-11-29 MED ORDER — ALBUTEROL SULFATE (2.5 MG/3ML) 0.083% IN NEBU
2.5000 mg | INHALATION_SOLUTION | Freq: Once | RESPIRATORY_TRACT | Status: AC
  Administered 2023-11-29: 2.5 mg via RESPIRATORY_TRACT

## 2023-11-29 MED ORDER — DOXYCYCLINE HYCLATE 100 MG PO CAPS: 100.0000 mg | ORAL_CAPSULE | Freq: Two times a day (BID) | ORAL | 0 refills | Status: AC

## 2023-11-29 MED ORDER — PROMETHAZINE-DM 6.25-15 MG/5ML PO SYRP: 5.0000 mL | ORAL_SOLUTION | Freq: Four times a day (QID) | ORAL | 0 refills | Status: AC | PRN

## 2023-11-29 NOTE — Discharge Instructions (Addendum)
-  The x-ray was negative for pneumonia. - You have bronchitis.  You also have an asthma exacerbation.  I sent prednisone for you to begin and cough medicine.  Continue use of your inhaler and/or nebulizer. - If not improving in a couple days after you start the prednisone start the antibiotic to cover you for possible walking pneumonia or developing bacterial bronchitis.  This also covers sinusitis. - Go to emergency department for any breathing difficulty not relieved by use of your inhaler uncontrolled fever or weakness.

## 2023-11-29 NOTE — ED Triage Notes (Signed)
 Patient has history of asthma and pneumonia.  Patient c/o cough, chest congestion, wheezing and SOB that started on Friday.  Patient reports chills.

## 2023-11-29 NOTE — ED Provider Notes (Signed)
 [facility]    CSN: [id number] Arrival date & time: 11/29/23  1257      History   Chief Complaint Chief Complaint  Patient presents with   Wheezing   Shortness of Breath    HPI Lisa Hogan is a 28 y.o. female presenting for fatigue, chills, cough, congestion, wheezing and shortness of breath x 2 days.  Patient reports being ill for about 1 week with cough and congestion but it got worse in the past couple days.  Denies fever, sore throat, chest pain, abdominal pain, n/v/d. Patient has history of asthma and pneumonia.  She says her symptoms feel like when she had pneumonia before.  Has been using albuterol and taken over-the-counter cough medicine.  HPI  Past Medical History:  Diagnosis Date   Anxiety    Asthma     Patient Active Problem List   Diagnosis Date Noted   Obesity 12/24/2019   Type O blood, Rh positive 12/24/2019    Past Surgical History:  Procedure Laterality Date   NO PAST SURGERIES      OB History     Gravida  1   Para      Term      Preterm      AB      Living         SAB      IAB      Ectopic      Multiple      Live Births               Home Medications    Prior to Admission medications   Medication Sig Start Date End Date Taking? Authorizing Provider  doxycycline (VIBRAMYCIN) 100 MG capsule Take 1 capsule (100 mg total) by mouth 2 (two) times daily for 7 days. 11/29/23 12/06/23 Yes Shirlee Latch, PA-C  predniSONE (DELTASONE) 20 MG tablet Take 2 tablets (40 mg total) by mouth daily for 5 days. 11/29/23 12/04/23 Yes Shirlee Latch, PA-C  promethazine-dextromethorphan (PROMETHAZINE-DM) 6.25-15 MG/5ML syrup Take 5 mLs by mouth 4 (four) times daily as needed. 11/29/23  Yes Shirlee Latch, PA-C  acetaminophen (TYLENOL) 325 MG tablet Take 500 mg by mouth every 6 (six) hours as needed.    [provider]  albuterol (VENTOLIN HFA) 108 (90 Base) MCG/ACT inhaler Inhale 2 puffs into the lungs every 4 (four) hours  as needed. 04/10/23   Becky Augusta, NP  EPINEPHrine 0.3 mg/0.3 mL IJ SOAJ injection Inject into the muscle.  01/28/19   [provider]  ferrous sulfate 325 (65 FE) MG tablet Take 1 tablet (325 mg total) by mouth daily with breakfast. 03/11/20   Doreene Burke, CNM  JUNEL FE 1.5/30 1.5-30 MG-MCG tablet Take 1 tablet by mouth daily.    [provider]  Spacer/Aero-Holding Chambers (AEROCHAMBER MV) inhaler Use as instructed 04/10/23   Becky Augusta, NP    Family History Family History  Problem Relation Age of Onset   Seizures Mother    Breast cancer Maternal Grandmother    Cancer Maternal Grandmother    Pancreatic cancer Maternal Grandmother     Social History Social History   Tobacco Use   Smoking status: Never   Smokeless tobacco: Never  Vaping Use   Vaping status: Never Used  Substance Use Topics   Alcohol use: Not Currently    Comment: occasionally   Drug use: No     Allergies   Shellfish allergy   Review of Systems Review of  Systems  Constitutional:  Positive for fatigue. Negative for chills, diaphoresis and fever.  HENT:  Positive for congestion and rhinorrhea. Negative for ear pain, sinus pressure, sinus pain and sore throat.   Respiratory:  Positive for cough, chest tightness, shortness of breath and wheezing.   Cardiovascular:  Negative for chest pain.  Gastrointestinal:  Negative for abdominal pain, nausea and vomiting.  Musculoskeletal:  Negative for arthralgias and myalgias.  Skin:  Negative for rash.  Neurological:  Negative for weakness and headaches.  Hematological:  Negative for adenopathy.     Physical Exam Triage Vital Signs ED Triage Vitals  Encounter Vitals Group     BP 11/29/23 1319 126/83     Systolic BP Percentile --      Diastolic BP Percentile --      Pulse Rate 11/29/23 1319 81     Resp 11/29/23 1319 14     Temp 11/29/23 1319 98.5 F (36.9 C)     Temp Source 11/29/23 1319 Oral     SpO2 11/29/23 1319 100 %     Weight  11/29/23 1317 279 lb 15.8 oz (127 kg)     Height 11/29/23 1317 5\' 9"  (1.753 m)     Head Circumference --      Peak Flow --      Pain Score 11/29/23 1317 0     Pain Loc --      Pain Education --      Exclude from Growth Chart --    No data found.  Updated Vital Signs BP 126/83 (BP Location: Left Arm)   Pulse 81   Temp 98.5 F (36.9 C) (Oral)   Resp 14   Ht 5\' 9"  (1.753 m)   Wt 279 lb 15.8 oz (127 kg)   LMP 11/01/2023   SpO2 100%   BMI 41.35 kg/m      Physical Exam Vitals and nursing note reviewed.  Constitutional:      General: She is not in acute distress.    Appearance: Normal appearance. She is not ill-appearing or toxic-appearing.  HENT:     Head: Normocephalic and atraumatic.     Nose: Congestion present.     Mouth/Throat:     Mouth: Mucous membranes are moist.     Pharynx: Oropharynx is clear.  Eyes:     General: No scleral icterus.       Right eye: No discharge.        Left eye: No discharge.     Conjunctiva/sclera: Conjunctivae normal.  Cardiovascular:     Rate and Rhythm: Normal rate and regular rhythm.     Heart sounds: Normal heart sounds.  Pulmonary:     Effort: Pulmonary effort is normal. No respiratory distress.     Breath sounds: Examination of the right-upper field reveals wheezing. Examination of the right-middle field reveals wheezing. Wheezing and rhonchi present.  Musculoskeletal:     Cervical back: Neck supple.  Skin:    General: Skin is dry.  Neurological:     General: No focal deficit present.     Mental Status: She is alert. Mental status is at baseline.     Motor: No weakness.     Gait: Gait normal.  Psychiatric:        Mood and Affect: Mood normal.        Behavior: Behavior normal.      UC Treatments / Results  Labs (all labs ordered are listed, but only abnormal results are displayed) Labs Reviewed - No  data to display  EKG   Radiology DG Chest 2 View Result Date: 11/29/2023 CLINICAL DATA:  shortness of breath,  wheezing and cough x 2 days EXAM: CHEST - 2 VIEW COMPARISON:  July 28, 2015 FINDINGS: The cardiomediastinal silhouette is normal in contour. No pleural effusion. No pneumothorax. No acute pleuroparenchymal abnormality. Visualized abdomen is unremarkable. No acute osseous abnormality noted. IMPRESSION: No acute cardiopulmonary abnormality. Electronically Signed   By: Meda Klinefelter M.D.   On: 11/29/2023 13:39    Procedures Procedures (including critical care time)  Medications Ordered in UC Medications  albuterol (PROVENTIL) (2.5 MG/3ML) 0.083% nebulizer solution 2.5 mg (2.5 mg Nebulization Given 11/29/23 1347)    Initial Impression / Assessment and Plan / UC Course  I have reviewed the triage vital signs and the nursing notes.  Pertinent labs & imaging results that were available during my care of the patient were reviewed by me and considered in my medical decision making (see chart for details).   28 year old female with history of asthma presents for 2-day history of fatigue, chills, cough, congestion, wheezing and shortness of breath.  Concerned about possible pneumonia.  Vitals are normal and stable.  Overall well-appearing patient.  No acute distress.  On exam has nasal congestion.  Throat is clear.  Rhonchi throughout right upper and middle lung fields.  Heart regular rate and rhythm.  Chest x-ray obtained.  Wet read suspicious for possible right middle lobe pneumonia.  Albuterol nebulizer given.  Chest x-ray over read negative.  Reviewed result with patient.  Acute bronchitis and asthma exacerbation.  Treating at this time with prednisone, Promethazine DM and albuterol.  Prescription written for doxycycline for her to begin in a couple of days if she is not improving.  Thoroughly reviewed return and ER precautions.   Final Clinical Impressions(s) / UC Diagnoses   Final diagnoses:  Shortness of breath  Acute bronchitis, unspecified organism  Wheezing  Exacerbation of  asthma, unspecified asthma severity, unspecified whether persistent     Discharge Instructions      -The x-ray was negative for pneumonia. - You have bronchitis.  You also have an asthma exacerbation.  I sent prednisone for you to begin and cough medicine.  Continue use of your inhaler and/or nebulizer. - If not improving in a couple days after you start the prednisone start the antibiotic to cover you for possible walking pneumonia or developing bacterial bronchitis.  This also covers sinusitis. - Go to emergency department for any breathing difficulty not relieved by use of your inhaler uncontrolled fever or weakness.     ED Prescriptions     Medication Sig Dispense Auth. Provider   predniSONE (DELTASONE) 20 MG tablet Take 2 tablets (40 mg total) by mouth daily for 5 days. 10 tablet Shirlee Latch, PA-C   promethazine-dextromethorphan (PROMETHAZINE-DM) 6.25-15 MG/5ML syrup Take 5 mLs by mouth 4 (four) times daily as needed. 118 mL Eusebio Friendly B, PA-C   doxycycline (VIBRAMYCIN) 100 MG capsule Take 1 capsule (100 mg total) by mouth 2 (two) times daily for 7 days. 14 capsule Shirlee Latch, PA-C      PDMP not reviewed this encounter.   Shirlee Latch, PA-C 11/29/23 1400
